# Patient Record
Sex: Male | Born: 1950 | Race: White | Hispanic: No | Smoking: Former smoker
Health system: Southern US, Community
[De-identification: ages and names within clinical notes are randomized; demographics above are authoritative.]

## PROBLEM LIST (undated history)

## (undated) DIAGNOSIS — I10 Essential (primary) hypertension: Secondary | ICD-10-CM

## (undated) DIAGNOSIS — Z87442 Personal history of urinary calculi: Secondary | ICD-10-CM

## (undated) DIAGNOSIS — C801 Malignant (primary) neoplasm, unspecified: Secondary | ICD-10-CM

## (undated) DIAGNOSIS — I2699 Other pulmonary embolism without acute cor pulmonale: Secondary | ICD-10-CM

## (undated) HISTORY — PX: OTHER SURGICAL HISTORY: SHX169

## (undated) HISTORY — PX: ABDOMINAL SURGERY: SHX537

## (undated) HISTORY — PX: PROCTECTOMY: SHX315

## (undated) HISTORY — PX: HERNIA REPAIR: SHX51

## (undated) HISTORY — PX: KIDNEY STONE SURGERY: SHX686

---

## 1999-12-01 ENCOUNTER — Emergency Department (HOSPITAL_COMMUNITY): Admission: EM | Admit: 1999-12-01 | Discharge: 1999-12-01 | Payer: Self-pay | Admitting: Emergency Medicine

## 2001-07-21 ENCOUNTER — Ambulatory Visit (HOSPITAL_COMMUNITY): Admission: RE | Admit: 2001-07-21 | Discharge: 2001-07-21 | Payer: Self-pay | Admitting: Pediatrics

## 2001-07-21 ENCOUNTER — Encounter: Payer: Self-pay | Admitting: Pediatrics

## 2006-11-20 ENCOUNTER — Inpatient Hospital Stay (HOSPITAL_COMMUNITY): Admission: EM | Admit: 2006-11-20 | Discharge: 2006-11-21 | Payer: Self-pay | Admitting: Emergency Medicine

## 2006-11-21 ENCOUNTER — Ambulatory Visit: Payer: Self-pay | Admitting: Cardiology

## 2006-12-17 ENCOUNTER — Emergency Department (HOSPITAL_COMMUNITY): Admission: EM | Admit: 2006-12-17 | Discharge: 2006-12-17 | Payer: Self-pay | Admitting: Emergency Medicine

## 2006-12-22 ENCOUNTER — Emergency Department (HOSPITAL_COMMUNITY): Admission: EM | Admit: 2006-12-22 | Discharge: 2006-12-22 | Payer: Self-pay | Admitting: Emergency Medicine

## 2007-03-01 ENCOUNTER — Inpatient Hospital Stay (HOSPITAL_COMMUNITY): Admission: EM | Admit: 2007-03-01 | Discharge: 2007-03-03 | Payer: Self-pay | Admitting: Emergency Medicine

## 2008-08-17 IMAGING — CT CT CHEST W/ CM
2 of 3 series · 15 of 36 positions shown, 18 images · IV contrast (Omnipaque 300)
Comparison: None.

CLINICAL DATA: Smoking history.  Question lung nodule on chest x-ray of 11/20/06. 
 CHEST CT WITH CONTRAST:
TECHNIQUE: Multidetector CT imaging of the chest was performed following the standard protocol during bolus administration of intravenous contrast.
 Contrast:  80 cc Omnipaque 300.

[Series 2: chestroutine 5.0 b40f · axial · 0.63mm/px · z∈[-324,-34]mm · 12 of 70 slices shown, 15 images]
[im 6/70  mediastinal]
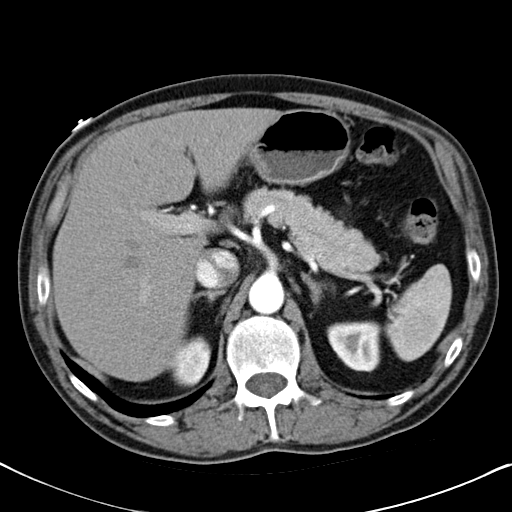
[im 6/70  lung]
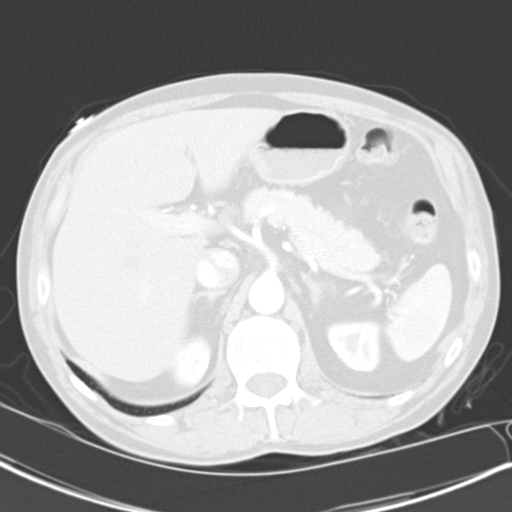
[im 11/70  lung]
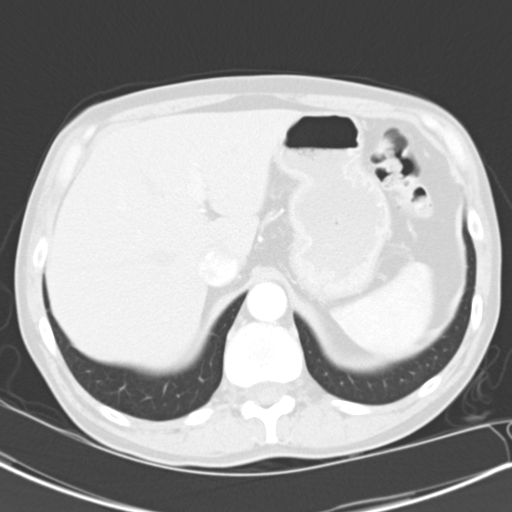
[im 16/70  lung]
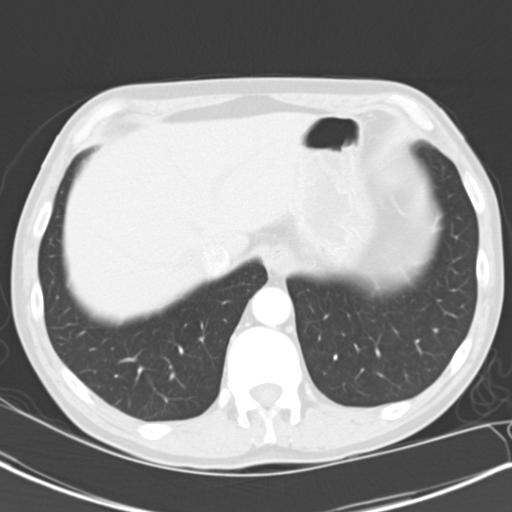
[im 21/70  lung]
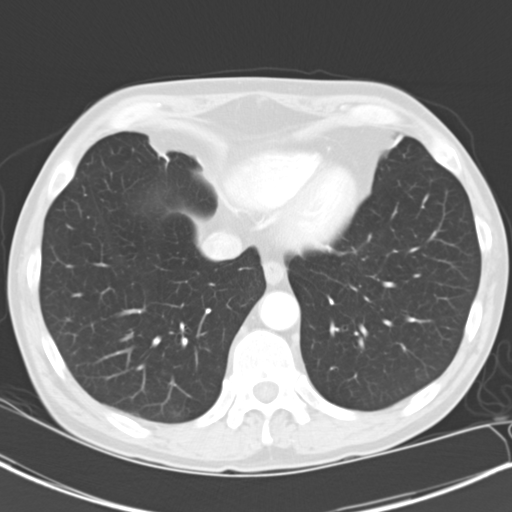
[im 26/70  mediastinal]
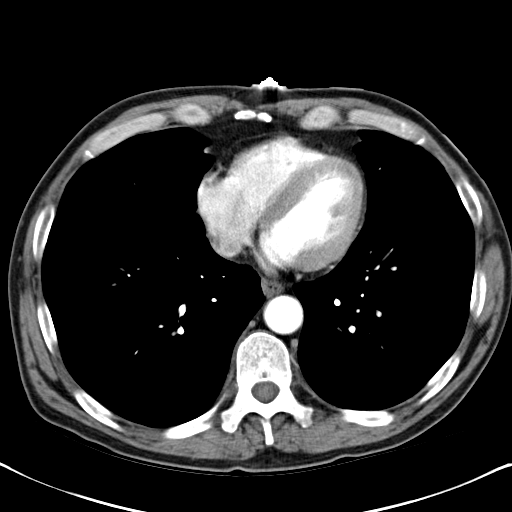
[im 26/70  lung]
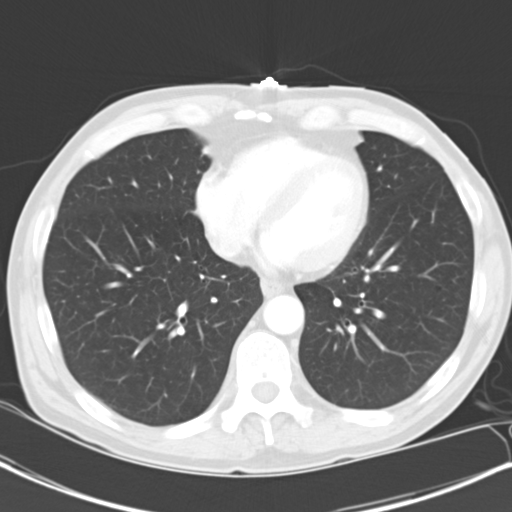
[im 31/70  lung]
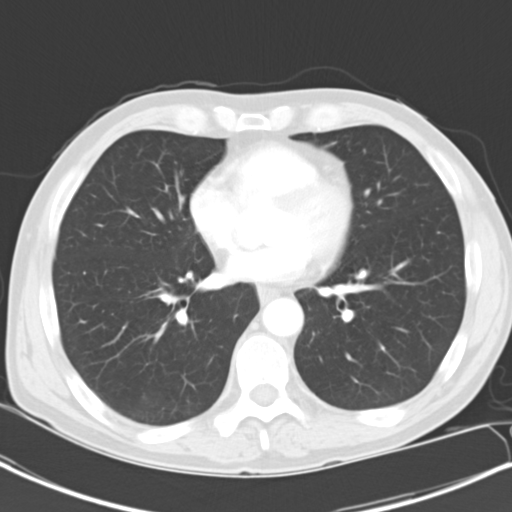
[im 39/70  lung]
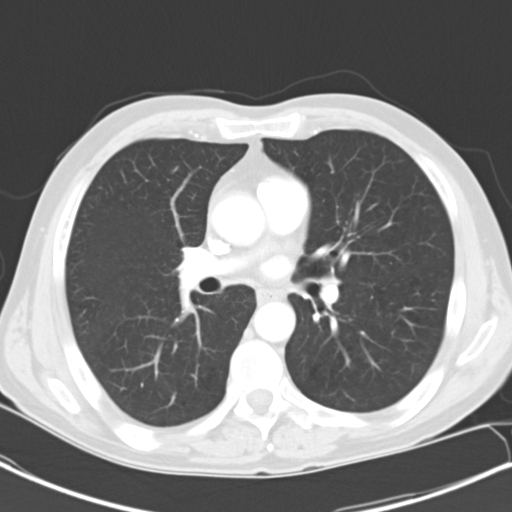
[im 44/70  lung]
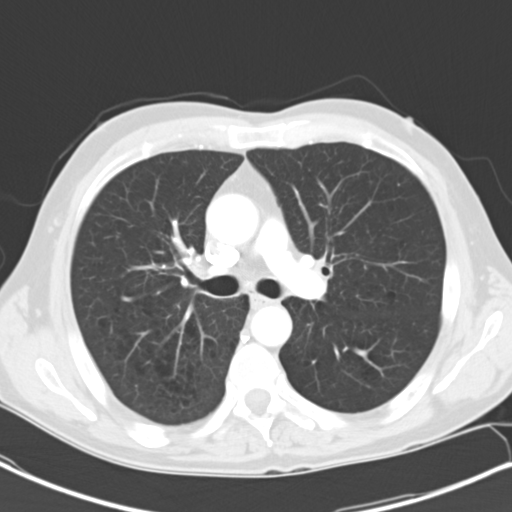
[im 49/70  mediastinal]
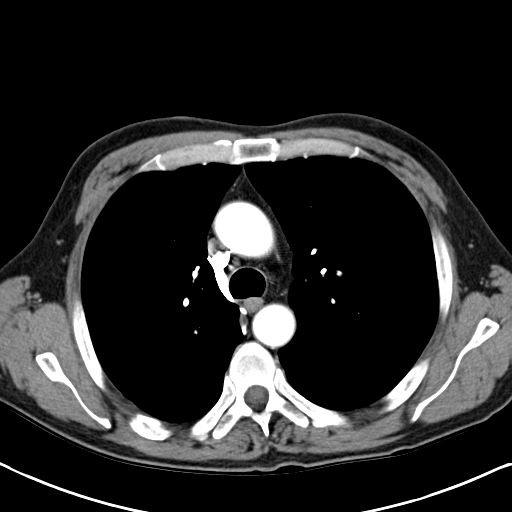
[im 49/70  lung]
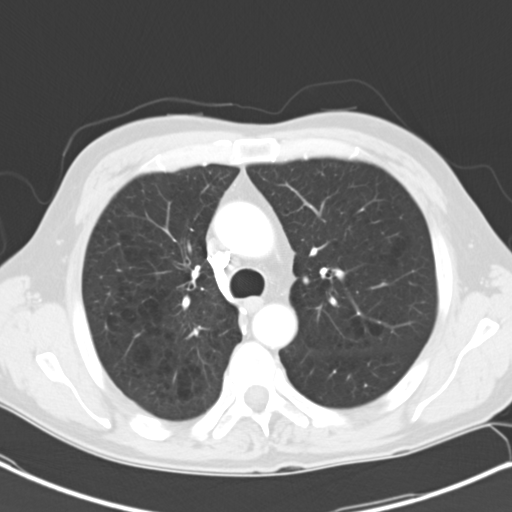
[im 54/70  lung]
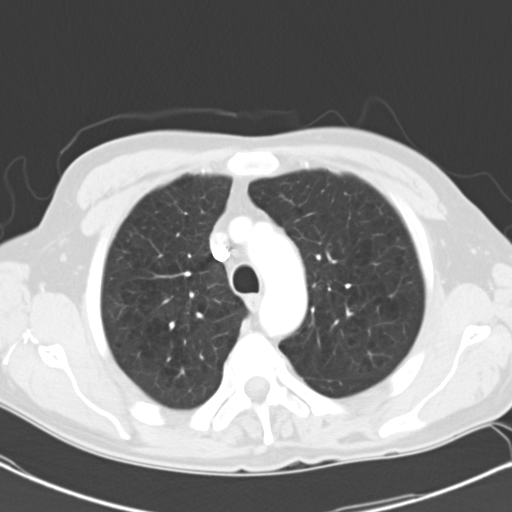
[im 59/70  lung]
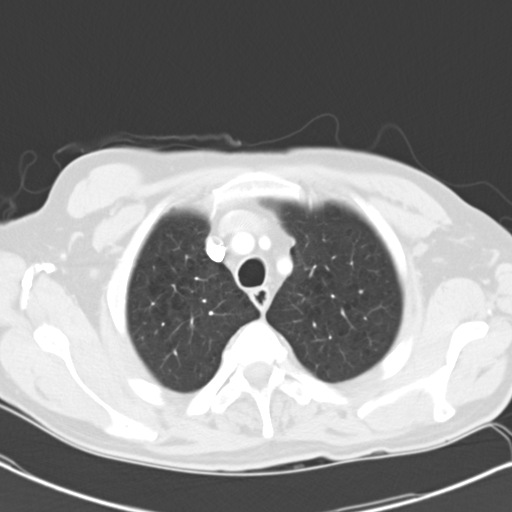
[im 64/70  lung]
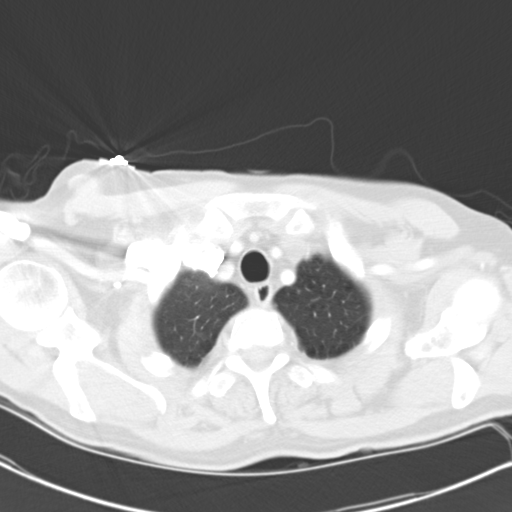

[Series 4: mpr coronal chest · coronal · 0.64mm/px · 3 of 70 slices shown]
[im 14/70  lung]
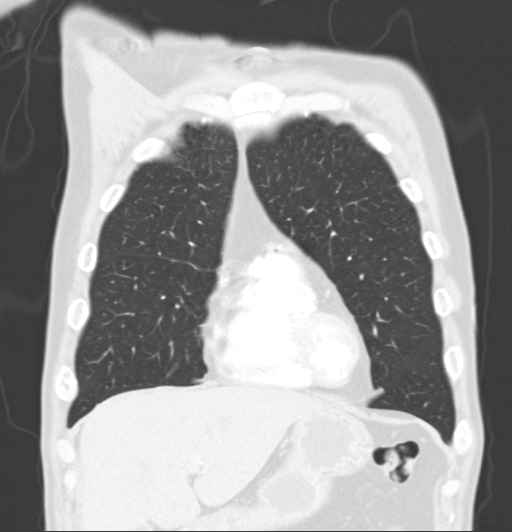
[im 28/70  lung]
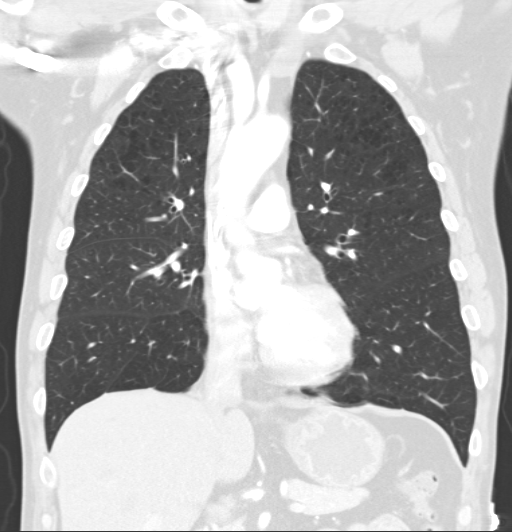
[im 42/70  lung]
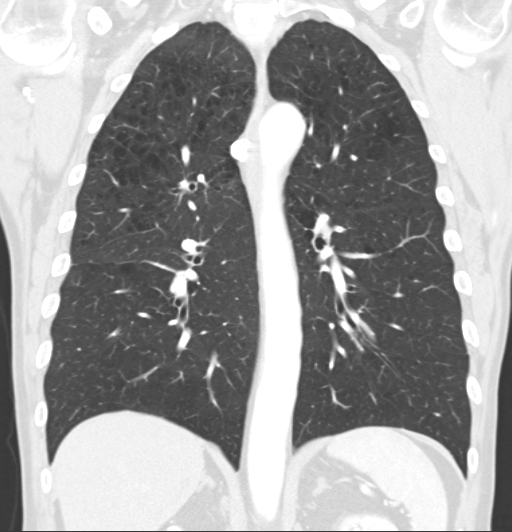

[15 of 36 positions shown; findings below may reference images not displayed]

FINDINGS: The nodule questioned in the right upper lung field on chest x-ray represents a small blastic lesion with in the medial right scapula consistent with a benign bony lesion if the patient has no history of metastatic disease.  There are diffuse changes of COPD.  However, no definite lung nodule is seen.  No effusion is noted.  No mediastinal or hilar adenopathy is seen.  Pulmonary arteries and thoracic aorta opacify with no acute abnormality.
IMPRESSION: 1.  The questioned lung nodule on chest x-ray in the right upper lung field represents a small blastic right scapular lesion, most likely benign if the patient does not have a history of malignancy.    No lung nodule is seen.  
 2.  Changes of COPD.

## 2012-01-24 ENCOUNTER — Emergency Department (HOSPITAL_COMMUNITY)
Admission: EM | Admit: 2012-01-24 | Discharge: 2012-01-24 | Disposition: A | Discharge status: Home / Self Care | Payer: Non-veteran care | Attending: Emergency Medicine | Admitting: Emergency Medicine

## 2012-01-24 ENCOUNTER — Other Ambulatory Visit: Payer: Self-pay

## 2012-01-24 ENCOUNTER — Emergency Department (HOSPITAL_COMMUNITY): Payer: Non-veteran care

## 2012-01-24 ENCOUNTER — Encounter (HOSPITAL_COMMUNITY): Payer: Self-pay | Admitting: Emergency Medicine

## 2012-01-24 DIAGNOSIS — R071 Chest pain on breathing: Secondary | ICD-10-CM | POA: Insufficient documentation

## 2012-01-24 DIAGNOSIS — R0602 Shortness of breath: Secondary | ICD-10-CM | POA: Insufficient documentation

## 2012-01-24 DIAGNOSIS — I10 Essential (primary) hypertension: Secondary | ICD-10-CM | POA: Insufficient documentation

## 2012-01-24 DIAGNOSIS — R0789 Other chest pain: Secondary | ICD-10-CM

## 2012-01-24 HISTORY — DX: Essential (primary) hypertension: I10

## 2012-01-24 LAB — CBC
HCT: 42.2 % (ref 39.0–52.0)
MCHC: 34.8 g/dL (ref 30.0–36.0)
MCV: 97.5 fL (ref 78.0–100.0)
RDW: 12.3 % (ref 11.5–15.5)
WBC: 7.9 10*3/uL (ref 4.0–10.5)

## 2012-01-24 LAB — DIFFERENTIAL
Basophils Relative: 1 % (ref 0–1)
Lymphs Abs: 2.5 10*3/uL (ref 0.7–4.0)
Monocytes Relative: 9 % (ref 3–12)
Neutrophils Relative %: 56 % (ref 43–77)

## 2012-01-24 LAB — BASIC METABOLIC PANEL
BUN: 6 mg/dL (ref 6–23)
Creatinine, Ser: 0.68 mg/dL (ref 0.50–1.35)
Sodium: 137 mEq/L (ref 135–145)

## 2012-01-24 MED ORDER — HYDROCODONE-ACETAMINOPHEN 5-325 MG PO TABS: 1.0000 | ORAL_TABLET | Freq: Four times a day (QID) | ORAL | Status: AC | PRN

## 2012-01-24 MED ORDER — NAPROXEN 500 MG PO TABS: 500.0000 mg | ORAL_TABLET | Freq: Two times a day (BID) | ORAL | Status: AC

## 2012-01-24 MED ORDER — OXYCODONE-ACETAMINOPHEN 5-325 MG PO TABS
1.0000 | ORAL_TABLET | Freq: Once | ORAL | Status: AC
  Administered 2012-01-24: 1 via ORAL
  Filled 2012-01-24: qty 1

## 2012-01-24 NOTE — ED Notes (Signed)
Patient with no complaints at this time. Respirations even and unlabored. Skin warm/dry. Discharge instructions reviewed with patient at this time. Patient given opportunity to voice concerns/ask questions. IV removed per policy and band-aid applied to site. Patient discharged at this time and left Emergency Department with steady gait.  

## 2012-01-24 NOTE — ED Provider Notes (Signed)
History  This chart was scribed for Gary Lennert, MD by Bennett Scrape. This patient was seen in room APA11/APA11 and the patient's care was started at 3:11PM.  CSN: [id number]  Arrival date & time 01/24/12  1427   First MD Initiated Contact with Patient 01/24/12 1508      Chief Complaint  Patient presents with  . Chest Pain    Patient is a 61 y.o. male presenting with chest pain. The history is provided by the patient. No language interpreter was used.  Chest Pain The chest pain began more  than 1 month ago. Chest pain occurs constantly. The chest pain is unchanged. The severity of the pain is moderate. The quality of the pain is described as pressure-like and sharp. The pain radiates to the left neck and left arm. Primary symptoms include shortness of breath. Pertinent negatives for primary symptoms include no fever, no fatigue, no cough, no abdominal pain, no nausea and no vomiting. Risk factors include no known risk factors.  His past medical history is significant for hypertension.  Pertinent negatives for past medical history include no MI, no PE and no seizures.  Pertinent negatives for family medical history include: no PE in family and no stroke in family.     Gary Hood is a 61 y.o. male who presents to the Emergency Department complaining of 3 months of gradual onset, non-changing, constant left chest pain described as a pressure feeling. The chest pain radiates into the left neck and left arm. He lists intermittent SOB as an associated symptom. Pt states that the pt is worse with movement of the left arm. He reports that occasionally the pain becomes a really sharp intense pain with no known cause. Pt states that he came to the ED today, becuase the pain is just not going away. He reports [facility] and [facility] told that the cause of the pain was not heart related. He states that he did have blood work done but no chest x-ray. He was treated with  pain shots that he states temporarily improved the pain. He has not tried any medications at home to improve the pain. He denies abdominal pain, nausea, emesis, diarrhea, cough, and fever as associated symptoms. He has a h/o HTN. He is a former smoker (Quit 2 years ago) and occasional alcohol user.  Pt's PCP is Dr. Malen Gauze at the [facility].  Past Medical History  Diagnosis Date  . Hypertension     Past Surgical History  Procedure Date  . Abdominal surgery   . Kidney stone surgery   . Hernia repair   . Skin cancer removal     No family history on file.  History  Substance Use Topics  . Smoking status: Current Some Day Smoker    Types: Pipe  . Smokeless tobacco: Not on file  . Alcohol Use: Yes     "occasionally"      Review of Systems  Constitutional: Negative for fever and fatigue.  HENT: Negative for congestion, sinus pressure and ear discharge.   Eyes: Negative for discharge.  Respiratory: Positive for shortness of breath. Negative for cough.   Cardiovascular: Positive for chest pain.  Gastrointestinal: Negative for nausea, vomiting, abdominal pain and diarrhea.  Genitourinary: Negative for frequency and hematuria.  Musculoskeletal: Negative for back pain.  Skin: Negative for rash.  Neurological: Negative for seizures and headaches.  Hematological: Negative.   Psychiatric/Behavioral: Negative for hallucinations.    Allergies  Codeine  Home Medications  No current outpatient prescriptions on file.  Triage Vitals: BP 147/82  Pulse 85  Temp(Src) 98.2 F (36.8 C) (Oral)  Resp 16  Ht 5\' 9"  (1.753 m)  Wt 135 lb (61.236 kg)  BMI 19.94 kg/m2  SpO2 98%  Physical Exam  Constitutional: He is oriented to person, place, and time. He appears well-developed.  HENT:  Head: Normocephalic and atraumatic.  Eyes: Conjunctivae and EOM are normal. No scleral icterus.  Neck: Neck supple. No thyromegaly present.  Cardiovascular: Normal rate and regular rhythm.  Exam reveals no  gallop and no friction rub.   No murmur heard. Pulmonary/Chest: No stridor. He has no wheezes. He has no rales. He exhibits tenderness (Left anterior chest tenderness).  Abdominal: He exhibits no distension. There is no tenderness. There is no rebound.  Musculoskeletal: Normal range of motion. He exhibits no edema.  Lymphadenopathy:    He has no cervical adenopathy.  Neurological: He is oriented to person, place, and time. Coordination normal.  Skin: No rash noted. No erythema.       Cyst about 1.5 cm in the inner thigh of the left leg, it is non-tender  Psychiatric: He has a normal mood and affect. His behavior is normal.    ED Course  Procedures (including critical care time)  DIAGNOSTIC STUDIES: Oxygen Saturation is 98% on room air, normal by my interpretation.    COORDINATION OF CARE: 3:14PM-Discussed blood panel, chest x-ray and pain medications with pt and pt agreed to plan. 6:47PM-Discussed blood panel and chest x-ray results with pt and pt acknowledged results. Discussed discharge plan and pt agreed to plan.Checked a cyst on left leg near knee. Advised pt to follow up if cyst becomes painful or inflamed.  Labs Reviewed  BASIC METABOLIC PANEL - Abnormal; Notable for the following:    Potassium 3.4 (*)    Glucose, Bld 108 (*)    All other components within normal limits  CBC  DIFFERENTIAL  TROPONIN I  D-DIMER, QUANTITATIVE   Dg Chest Portable 1 View  01/24/2012  *RADIOLOGY REPORT*  Clinical Data: Chest pain concentrated on the left  PORTABLE CHEST - 1 VIEW  Comparison: Portable exam 1507 hours compared to 11/20/2006  Findings: Normal heart size, mediastinal contours, and pulmonary vascularity. Lungs emphysematous but clear. No pleural effusion or pneumothorax. No acute osseous findings.  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  Original Report Authenticated By: Lollie Marrow, M.D.     No diagnosis found.  .ededk  Date: 01/24/2012  Rate:84  Rhythm:  normal sinus rhythm  QRS Axis: normal  Intervals: normal  ST/T Wave abnormalities: normal  Conduction Disutrbances:none  Narrative Interpretation:   Old EKG Reviewed: none available    MDM    Normal studies and constant chest pain worse with movement    The chart was scribed for me under my direct supervision.  I personally performed the history, physical, and medical decision making and all procedures in the evaluation of this patient.Gary Lennert, MD 01/24/12 [phone]

## 2012-01-24 NOTE — Discharge Instructions (Signed)
Follow up with your md in 1 week for recheck °

## 2012-01-24 NOTE — ED Notes (Signed)
Patient with c/o chest pain, described as a pressure, that radiates to left arm, left neck and left ear. Patient reports CP started one month ago and has been intermittent until 1 week ago when it became constant. Reports intermittent shortness of breath.

## 2018-03-23 ENCOUNTER — Other Ambulatory Visit: Payer: Self-pay

## 2018-03-23 ENCOUNTER — Encounter (HOSPITAL_COMMUNITY): Payer: Self-pay

## 2018-03-23 ENCOUNTER — Emergency Department (HOSPITAL_COMMUNITY)
Admission: EM | Admit: 2018-03-23 | Discharge: 2018-03-23 | Disposition: A | Discharge status: Home / Self Care | Payer: Non-veteran care | Attending: Emergency Medicine | Admitting: Emergency Medicine

## 2018-03-23 DIAGNOSIS — Y69 Unspecified misadventure during surgical and medical care: Secondary | ICD-10-CM | POA: Diagnosis not present

## 2018-03-23 DIAGNOSIS — I10 Essential (primary) hypertension: Secondary | ICD-10-CM | POA: Insufficient documentation

## 2018-03-23 DIAGNOSIS — K117 Disturbances of salivary secretion: Secondary | ICD-10-CM

## 2018-03-23 DIAGNOSIS — T7840XA Allergy, unspecified, initial encounter: Secondary | ICD-10-CM | POA: Diagnosis present

## 2018-03-23 DIAGNOSIS — F17200 Nicotine dependence, unspecified, uncomplicated: Secondary | ICD-10-CM | POA: Insufficient documentation

## 2018-03-23 DIAGNOSIS — L299 Pruritus, unspecified: Secondary | ICD-10-CM | POA: Insufficient documentation

## 2018-03-23 DIAGNOSIS — R682 Dry mouth, unspecified: Secondary | ICD-10-CM | POA: Diagnosis not present

## 2018-03-23 DIAGNOSIS — T887XXA Unspecified adverse effect of drug or medicament, initial encounter: Secondary | ICD-10-CM | POA: Diagnosis not present

## 2018-03-23 MED ORDER — BIOTENE DRY MOUTH MT LIQD: 15.0000 mL | OROMUCOSAL | 0 refills | Status: DC | PRN

## 2018-03-23 MED ORDER — DEXAMETHASONE 4 MG PO TABS
10.0000 mg | ORAL_TABLET | Freq: Once | ORAL | Status: AC
  Administered 2018-03-23: 10 mg via ORAL
  Filled 2018-03-23: qty 3

## 2018-03-23 NOTE — Discharge Instructions (Signed)
You may have had a mild reaction to one of your medications. You were given a medication in the ED to help with the itching. Please continue to take your medications as prescribed. Call [phone] if you develop shortness of breath, tongue or facial swelling, trouble swallowing. You may use biotene for your dry mouth.

## 2018-03-23 NOTE — ED Provider Notes (Signed)
[facility] EMERGENCY DEPARTMENT Provider Note   CSN: [id number] Arrival date & time: 03/23/18  1854     History   Chief Complaint Chief Complaint  Patient presents with  . Allergic Reaction    bilateral elbows itch    HPI Gary Hood is a 67 y.o. male.  Patient is one week s/p proctectomy. About one hour after taking his medicines today, he developed itching in the antecubital area of both arms. No noted rash or redness. Denies shortness of breath, difficulty swallowing, tongue swelling, nausea or vomiting.  He is also complaining of a significantly dry mouth with the use of the oxybutynin. He contacted his provider and was advised to discontinue the diclofenac due to ankle swelling. Patient is not currently in any distress. He has a urinary catheter in place that is draining urine.   The history is provided by the patient. No language interpreter was used.  Allergic Reaction  Presenting symptoms: itching   Presenting symptoms: no difficulty breathing, no difficulty swallowing, no rash, no swelling and no wheezing   Severity:  Mild Prior allergic episodes:  No prior episodes Context: medications   Ineffective treatments:  None tried   Past Medical History:  Diagnosis Date  . Hypertension     There are no active problems to display for this patient.   Past Surgical History:  Procedure Laterality Date  . ABDOMINAL SURGERY    . HERNIA REPAIR    . KIDNEY STONE SURGERY    . PROCTECTOMY    . skin cancer removal          Home Medications    Prior to Admission medications   Medication Sig Start Date End Date Taking? Authorizing Provider  chlorthalidone (HYGROTON) 25 MG tablet Take 6 mg by mouth daily. **Prescribed to Take one tablet by mouth on Mondays, Wednesdays, and Fridays only**    [provider]  ibuprofen (ADVIL,MOTRIN) 200 MG tablet Take 200 mg by mouth once as needed. For pain    [provider]    Family History No family history on  file.  Social History Social History   Tobacco Use  . Smoking status: Current Some Day Smoker    Types: Pipe  . Smokeless tobacco: Never Used  Substance Use Topics  . Alcohol use: Yes    Comment: "occasionally"  . Drug use: No     Allergies   Albumin (human); Aspirin; B12 folate [cobalamine combinations]; Codeine; and Diclofenac   Review of Systems Review of Systems  HENT: Negative for facial swelling and trouble swallowing.   Respiratory: Negative for wheezing.   Gastrointestinal: Negative for abdominal pain, nausea and vomiting.  Skin: Positive for itching. Negative for rash.  All other systems reviewed and are negative.    Physical Exam Updated Vital Signs BP 140/77 (BP Location: Right Arm)   Pulse 96   Temp 98.9 F (37.2 C) (Oral)   Resp 16   Ht 5\' 9"  (1.753 m)   Wt 63.5 kg (140 lb)   SpO2 97%   BMI 20.67 kg/m   Physical Exam  Constitutional: He is oriented to person, place, and time. He appears well-developed and well-nourished. No distress.  HENT:  Head: Normocephalic.  Eyes: Conjunctivae are normal.  Neck: Neck supple.  Cardiovascular: Normal rate and regular rhythm.  Pulmonary/Chest: Effort normal and breath sounds normal.  Abdominal: Soft. He exhibits no distension. There is no tenderness.  Musculoskeletal: Normal range of motion. He exhibits no edema.  Lymphadenopathy:  He has no cervical adenopathy.  Neurological: He is alert and oriented to person, place, and time.  Skin: Skin is warm and dry. No rash noted.  Psychiatric: He has a normal mood and affect.     ED Treatments / Results  Labs (all labs ordered are listed, but only abnormal results are displayed) Labs Reviewed - No data to display  EKG None  Radiology No results found.  Procedures Procedures (including critical care time)  Medications Ordered in ED Medications  dexamethasone (DECADRON) tablet 10 mg (10 mg Oral Given 03/23/18 2020)     Initial Impression /  Assessment and Plan / ED Course  I have reviewed the triage vital signs and the nursing notes.  Pertinent labs & imaging results that were available during my care of the patient were reviewed by me and considered in my medical decision making (see chart for details).    Patient discussed with and seen by Dr. Roxanne Mins.   Patient re-evaluated prior to dc, is hemodynamically stable, in no respiratory distress, and denies the feeling of throat closing. Pt has been advised to return to the ED if they have a mod-severe allergic rxn (s/s including throat closing, difficulty breathing, swelling of lips face or tongue). Pt is to follow up with their PCP. Pt is agreeable with plan & verbalizes understanding.  Final Clinical Impressions(s) / ED Diagnoses   Final diagnoses:  Xerostomia  Side effect of drug    ED Discharge Orders        Ordered    antiseptic oral rinse (BIOTENE) LIQD  As needed     03/23/18 2035       Etta Quill, NP 86/57/84 [id number]    Delora Fuel, MD 95/28/41 [phone]

## 2018-03-23 NOTE — ED Triage Notes (Signed)
Pt reports being prescribed oxybutynin, docusate, gabapentin, and diclofenac that was prescribed a week ago after have proctectomy.  Pt says he had an allergic rx to diclofenac of bilateral ankle swelling, so he quit taking it 2 days ago. Pt says now both elbows are itching, no other areas reported. No rash visible.

## 2018-10-11 ENCOUNTER — Encounter: Payer: Self-pay | Admitting: Allergy & Immunology

## 2018-10-11 ENCOUNTER — Ambulatory Visit (INDEPENDENT_AMBULATORY_CARE_PROVIDER_SITE_OTHER): Payer: PRIVATE HEALTH INSURANCE | Admitting: Allergy & Immunology

## 2018-10-11 VITALS — BP 120/64 | HR 72 | Temp 97.6°F | Resp 16 | Ht 68.11 in | Wt 144.8 lb

## 2018-10-11 DIAGNOSIS — T781XXD Other adverse food reactions, not elsewhere classified, subsequent encounter: Secondary | ICD-10-CM | POA: Diagnosis not present

## 2018-10-11 NOTE — Patient Instructions (Addendum)
1. Adverse food reaction - We will get some labs to look for a red meat allergy (alpha gal syndrome). - We will get some labs to rule out mast cell disease (serum tryptase). - We will call you in 1-2 weeks with the results of the testing. - I do not think that additional testing is needed at this time since you tolerate the foods sometimes and then you do not tolerate them at other times. - I do not think that an EpiPen is needed at this time.   2. Return if symptoms worsen or fail to improve.   Please inform us of any Emergency Department visits, hospitalizations, or changes in symptoms. Call us before going to the ED for breathing or allergy symptoms since we might be able to fit you in for a sick visit. Feel free to contact us anytime with any questions, problems, or concerns.  It was a pleasure to meet you today!  Websites that have reliable patient information: 1. American Academy of Asthma, Allergy, and Immunology: www.aaaai.org 2. Food Allergy Research and Education (FARE): foodallergy.org 3. Mothers of Asthmatics: http://www.asthmacommunitynetwork.org 4. American College of Allergy, Asthma, and Immunology: MonthlyElectricBill.co.uk   Make sure you are registered to vote! If you have moved or changed any of your contact information, you will need to get this updated before voting!
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
## 2018-10-16 LAB — ALPHA-GAL PANEL
ALPHA GAL IGE: 80.8 kU/L — AB (ref <0.10)
Beef (Bos spp) IgE: 23.4 kU/L — ABNORMAL HIGH (ref <0.35)
Class Interpretation: 2
Class Interpretation: 4
Lamb/Mutton (Ovis spp) IgE: 3.29 kU/L — ABNORMAL HIGH (ref <0.35)
PORK CLASS INTERPRETATION: 3
Pork (Sus spp) IgE: 6.67 kU/L — ABNORMAL HIGH (ref <0.35)

## 2018-10-16 LAB — IGE+ALLERGENS ZONE 2(30)
Alternaria Alternata IgE: <0.1 kU/L
Amer Sycamore IgE Qn: <0.1 kU/L
Aspergillus Fumigatus IgE: <0.1 kU/L
Cat Dander IgE: 0.99 kU/L — AB
Cockroach, American IgE: <0.1 kU/L
D Farinae IgE: <0.1 kU/L
Dog Dander IgE: 1.59 kU/L — AB
Hickory, White IgE: <0.1 kU/L
IGE (IMMUNOGLOBULIN E), SERUM: 778 [IU]/mL — AB (ref 6–495)
Maple/Box Elder IgE: <0.1 kU/L
Mucor Racemosus IgE: <0.1 kU/L
Nettle IgE: <0.1 kU/L
Penicillium Chrysogen IgE: <0.1 kU/L
Sheep Sorrel IgE Qn: <0.1 kU/L
Sweet gum IgE RAST Ql: <0.1 kU/L
White Mulberry IgE: <0.1 kU/L

## 2018-10-16 LAB — TRYPTASE: TRYPTASE: 9.1 ug/L (ref 2.2–13.2)

## 2018-10-17 ENCOUNTER — Other Ambulatory Visit: Payer: Self-pay

## 2018-10-17 MED ORDER — EPINEPHRINE 0.3 MG/0.3ML IJ SOAJ: 0.3000 mg | Freq: Once | INTRAMUSCULAR | 2 refills | Status: AC

## 2018-10-17 NOTE — Telephone Encounter (Signed)
Epi pen sent to patient local pharmacy due to is most recent labs showing him having allergies to red meat per Dr. Ernst Bowler.

## 2022-11-29 ENCOUNTER — Ambulatory Visit: Payer: Non-veteran care | Admitting: Urology

## 2022-12-06 ENCOUNTER — Other Ambulatory Visit: Payer: Self-pay | Admitting: *Deleted

## 2022-12-06 DIAGNOSIS — K4090 Unilateral inguinal hernia, without obstruction or gangrene, not specified as recurrent: Secondary | ICD-10-CM

## 2022-12-07 ENCOUNTER — Encounter: Payer: Self-pay | Admitting: Surgery

## 2022-12-07 ENCOUNTER — Ambulatory Visit (INDEPENDENT_AMBULATORY_CARE_PROVIDER_SITE_OTHER): Payer: No Typology Code available for payment source | Admitting: Surgery

## 2022-12-07 VITALS — BP 161/95 | HR 70 | Temp 98.3°F | Resp 12 | Ht 68.0 in | Wt 132.0 lb

## 2022-12-07 DIAGNOSIS — K4090 Unilateral inguinal hernia, without obstruction or gangrene, not specified as recurrent: Secondary | ICD-10-CM | POA: Diagnosis not present

## 2022-12-07 NOTE — Progress Notes (Signed)
[facility] History and Physical  Reason for Referral:*** Referring Physician: ***  Chief Complaint   New Patient (Initial Visit)     Gary Hood is a 72 y.o. male.  HPI: ***.  The *** started *** and has had a duration of ***.  It is associated with ***.  The *** is improved with ***, and is made worse with ***.    Quality*** Context***  Past Medical History:  Diagnosis Date  . Hypertension     Past Surgical History:  Procedure Laterality Date  . ABDOMINAL SURGERY    . HERNIA REPAIR    . KIDNEY STONE SURGERY    . PROCTECTOMY    . skin cancer removal      No family history on file.  Social History   Tobacco Use  . Smoking status: Some Days    Types: Pipe  . Smokeless tobacco: Never  Substance Use Topics  . Alcohol use: Yes    Comment: "occasionally"  . Drug use: No    Medications: {medication reviewed/display:3041432} Allergies as of 12/07/2022       Reactions   Albumin (human)    Aspirin    B12 Folate [cobalamin Combinations]    Codeine Itching   Diclofenac         Medication List        Accurate as of December 07, 2022  9:55 AM. If you have any questions, ask your nurse or doctor.          STOP taking these medications    cyanocobalamin 1000 MCG tablet Commonly known as: VITAMIN B12 Stopped by: Roshanna Cimino A Deeya Richeson, DO         ROS:  {Review of Systems:30496}  Blood pressure (!) 161/95, pulse 70, temperature 98.3 F (36.8 C), temperature source Oral, resp. rate 12, height 5' 8"$  (1.727 m), weight 132 lb (59.9 kg), SpO2 97 %. Physical Exam  Results: No results found for this or any previous visit (from the past 48 hour(s)).  No results found.   Assessment & Plan:  Gary Hood is a 72 y.o. male with *** -*** -*** -Follow up ***  All questions were answered to the satisfaction of the patient and family***.  The risk and benefits of *** were discussed including but not limited to ***.  After careful  consideration, Jaric Benetti has decided to ***.    Yliana Gravois A Mert Dietrick 12/07/2022, 9:55 AM   Graciella Freer, DO [facility] [address] [place], [place] [address] [phone] (office)

## 2022-12-09 NOTE — H&P (Signed)
[facility] History and Physical   Reason for Referral: Right inguinal hernia Referring Physician: [facility]   Chief Complaint   New Patient (Initial Visit)        Gary Hood is a 72 y.o. male.  HPI: Patient presents for evaluation of a right inguinal hernia.  Gary Hood first noticed this area last summer.  Gary Hood intermittently will have pain issues, and the hernia will self reduce in the evenings.  Gary Hood is tolerating a diet without nausea and vomiting, and is moving his bowels without issue.  His last bowel movement was this morning.  Gary Hood denies use of blood thinning medications.  His past medical history is significant for hypertension and heart ablation 6 years ago.  His surgical history is significant for left inguinal hernia surgery, prostate surgery, and multiple kidney stone surgeries.  Gary Hood denies use of tobacco products, alcohol, and illicit drugs.       Past Medical History:  Diagnosis Date   Hypertension             Past Surgical History:  Procedure Laterality Date   ABDOMINAL SURGERY       HERNIA REPAIR       KIDNEY STONE SURGERY       PROCTECTOMY       skin cancer removal          No family history on file.   Social History         Tobacco Use   Smoking status: Some Days      Types: Pipe   Smokeless tobacco: Never  Substance Use Topics   Alcohol use: Yes      Comment: "occasionally"   Drug use: No      Medications: I have reviewed the patient's current medications. Prior to Admission: (Not in a hospital admission)  Scheduled: Continuous: PRN: Allergies as of 12/07/2022         Reactions    Albumin (human)      Aspirin      B12 Folate [cobalamin Combinations]      Codeine Itching    Diclofenac              Medication List           Accurate as of December 07, 2022  9:55 AM. If you have any questions, ask your nurse or doctor.              STOP taking these medications     cyanocobalamin 1000 MCG tablet Commonly known  as: VITAMIN B12 Stopped by: Henley Boettner A Cheryel Kyte, DO               ROS:  Constitutional: negative for chills, fatigue, and fevers Eyes: negative for visual disturbance and pain Ears, nose, mouth, throat, and face: negative for ear drainage, sore throat, and sinus problems Respiratory: negative for cough, wheezing, and shortness of breath Cardiovascular: negative for chest pain and palpitations Gastrointestinal: positive for abdominal pain, negative for nausea, reflux symptoms, and vomiting Genitourinary:negative for dysuria and frequency Integument/breast: negative for dryness and rash Hematologic/lymphatic: negative for bleeding and lymphadenopathy Musculoskeletal:positive for back pain Neurological: negative for dizziness and tremors Endocrine: negative for temperature intolerance   Blood pressure (!) 161/95, pulse 70, temperature 98.3 F (36.8 C), temperature source Oral, resp. rate 12, height 5' 8"$  (1.727 m), weight 132 lb (59.9 kg), SpO2 97 %. Physical Exam Vitals reviewed.  Constitutional:      Appearance: Normal appearance.  HENT:  Head: Normocephalic and atraumatic.  Eyes:     Extraocular Movements: Extraocular movements intact.     Pupils: Pupils are equal, round, and reactive to light.  Cardiovascular:     Rate and Rhythm: Normal rate and regular rhythm.  Pulmonary:     Effort: Pulmonary effort is normal.     Breath sounds: Normal breath sounds.  Abdominal:     Comments: Abdomen soft, nondistended, no percussion tenderness, nontender to palpation; no rigidity, guarding, rebound tenderness; soft and reducible right inguinal hernia, no palpable left inguinal hernia  Musculoskeletal:        General: Normal range of motion.     Cervical back: Normal range of motion.  Skin:    General: Skin is warm and dry.  Neurological:     General: No focal deficit present.     Mental Status: Gary Hood is alert and oriented to person, place, and time.  Psychiatric:        Mood  and Affect: Mood normal.        Behavior: Behavior normal.        Results: Lab Results Last 48 Hours  No results found for this or any previous visit (from the past 48 hour(s)).     Imaging Results (Last 48 hours)  No results found.       Assessment & Plan:  Gary Hood is a 72 y.o. male who presents for evaluation of left inguinal hernia.   -I explained the pathophysiology of hernias, and why we recommend surgical repair -The risk and benefits of open right inguinal hernia repair with mesh were discussed including but not limited to bleeding, infection, injury to surrounding structures, need for additional procedures, and hernia recurrence.  After careful consideration, Gary Hood has decided to proceed with surgery.  -Patient tentatively scheduled for surgery on 2/21 -Information provided to the patient regarding inguinal hernias -Advised him to present to the emergency department if Gary Hood begins to have nonreducible hernia, nausea, vomiting, and obstipation   All questions were answered to the satisfaction of the patient.     Graciella Freer, DO [facility] [address] [place], [place] [address] [phone] (office)

## 2022-12-10 NOTE — Patient Instructions (Signed)
Gary Hood  12/10/2022     @PREFPERIOPPHARMACY$ @   Your procedure is scheduled on 12/15/2022.  Report to [facility] at 6:45 A.M.  Call this number if you have problems the morning of surgery:  [phone]  If you experience any cold or flu symptoms such as cough, fever, chills, shortness of breath, etc. between now and your scheduled surgery, please notify us at the above number.   Remember:  Do not eat or drink after midnight.     Take these medicines the morning of surgery with A SIP OF WATER : Claritin    Do not wear jewelry, make-up or nail polish.  Do not wear lotions, powders, or perfumes, or deodorant.  Do not shave 48 hours prior to surgery.  Men may shave face and neck.  Do not bring valuables to the hospital.  [facility] is not responsible for any belongings or valuables.  Contacts, dentures or bridgework may not be worn into surgery.  Leave your suitcase in the car.  After surgery it may be brought to your room.  For patients admitted to the hospital, discharge time will be determined by your treatment team.  Patients discharged the day of surgery will not be allowed to drive home.   Name and phone number of your driver:   Family Special instructions:  N/A  Please read over the following fact sheets that you were given. Care and Recovery After Surgery  Open Hernia Repair, Adult Open hernia repair is a surgical procedure to fix a hernia. A hernia occurs when an internal organ or tissue pushes through a weak spot in the muscles along the wall of the abdomen. Hernias commonly occur in the groin and around the belly button. Most hernias tend to get worse over time. Often, surgery is done to prevent the hernia from becoming bigger, uncomfortable, or an emergency. Emergency surgery may be needed if contents of the abdomen get stuck in the opening (incarcerated hernia) or if the blood supply gets cut off (strangulated hernia). In an open repair, an incision is made in  the abdomen to perform the surgery. Tell a health care provider about: Any allergies you have. All medicines you are taking, including vitamins, herbs, eye drops, creams, and over-the-counter medicines. Any problems you or family members have had with anesthetic medicines. Any blood or bone disorders you have. Any surgeries you have had. Any medical conditions you have, including any recent cold or flu (influenza)symptoms. Whether you are pregnant or may be pregnant. What are the risks? Generally, this is a safe procedure. However, problems may occur, including: Long-lasting (chronic) pain. Bleeding. Infection. Damage to the testicles. This can cause shrinking or swelling. Damage to nearby structures or organs, including the bladder, blood vessels, intestines, or nerves near the hernia. Blood clots. Trouble passing urine. Return of the hernia. What happens before the procedure? Staying hydrated Follow instructions from your health care provider about hydration, which may include: Up to 2 hours before the procedure - you may continue to drink clear liquids, such as water, clear fruit juice, black coffee, and plain tea.  Eating and drinking restrictions Follow instructions from your health care provider about eating and drinking, which may include: 8 hours before the procedure - stop eating heavy meals or foods, such as meat, fried foods, or fatty foods. 6 hours before the procedure - stop eating light meals or foods, such as toast or cereal. 6 hours before the procedure - stop drinking milk or drinks that  contain milk. 2 hours before the procedure - stop drinking clear liquids. Medicines Ask your health care provider about: Changing or stopping your regular medicines. This is especially important if you are taking diabetes medicines or blood thinners. Taking medicines such as aspirin and ibuprofen. These medicines can thin your blood. Do not take these medicines unless your health  care provider tells you to take them. Taking over-the-counter medicines, vitamins, herbs, and supplements. Surgery safety Ask your health care provider: How your surgery site will be marked. What steps will be taken to help prevent infection. These steps may include: Removing hair at the surgery site. Washing skin with a germ-killing soap. Receiving antibiotic medicine. General instructions You may have an exam or testing, such as blood tests or imaging studies. Do not use any products that contain nicotine or tobacco for at least 4 weeks before the procedure. These products include cigarettes, chewing tobacco, and vaping devices, such as e-cigarettes. If you need help quitting, ask your health care provider. Let your health care provider know if you develop a cold or any infection before your surgery. If you get an infection before surgery, you may receive antibiotics to treat it. Plan to have a responsible adult take you home from the hospital or clinic. If you will be going home right after the procedure, plan to have a responsible adult care for you for the time you are told. This is important. What happens during the procedure?  An IV will be inserted into one of your veins. You will be given one or more of the following: A medicine to help you relax (sedative). A medicine to numb the area (local anesthetic). A medicine to make you fall asleep (general anesthetic). Your surgeon will make an incision over the hernia. The tissues of the hernia will be moved back into place. The edges of the hernia may be stitched (sutured) together. The opening in the abdominal muscles will be closed with stitches (sutures). Or, your surgeon will place a mesh patch made of artificial (synthetic) material over the opening. The incision will be closed with sutures, skin glue, or adhesive strips. A bandage (dressing) may be placed over the incision. The procedure may vary among health care providers and  hospitals. What happens after the procedure? Your blood pressure, heart rate, breathing rate, and blood oxygen level will be monitored until you leave the hospital or clinic. You may be given medicine for pain. If you were given a sedative during the procedure, it can affect you for several hours. Do not drive or operate machinery until your health care provider says that it is safe. Summary Open hernia repair is a surgical procedure to fix a hernia. Hernias commonly occur in the groin and around the belly button. Emergency surgery may be needed if contents of the abdomen get stuck in the opening (incarcerated hernia) or if the blood supply gets cut off (strangulated hernia). In this procedure, an incision is made in the abdomen to perform the surgery. After the procedure, you may be given medicine for pain. This information is not intended to replace advice given to you by your health care provider. Make sure you discuss any questions you have with your health care provider. Document Revised: 05/26/2020 Document Reviewed: 05/26/2020 Elsevier Patient Education  [place] Anesthesia, Adult General anesthesia is the use of medicine to make you fall asleep (unconscious) for a medical procedure. General anesthesia must be used for certain procedures. It is often recommended for surgery  or procedures that: Last a long time. Require you to be still or in an unusual position. Are major and can cause blood loss. Affect your breathing. The medicines used for general anesthesia are called general anesthetics. During general anesthesia, these medicines are given along with medicines that: Prevent pain. Control your blood pressure. Relax your muscles. Prevent nausea and vomiting after the procedure. Tell a health care provider about: Any allergies you have. All medicines you are taking, including vitamins, herbs, eye drops, creams, and over-the-counter medicines. Your history of  any: Medical conditions you have, including: High blood pressure. Bleeding problems. Diabetes. Heart or lung conditions, such as: Heart failure. Sleep apnea. Asthma. Chronic obstructive pulmonary disease (COPD). Current or recent illnesses, such as: Upper respiratory, chest, or ear infections. Cough or fever. Tobacco or drug use, including marijuana or alcohol use. Depression or anxiety. Surgeries and types of anesthetics you have had. Problems you or family members have had with anesthetic medicines. Whether you are pregnant or may be pregnant. Whether you have any chipped or loose teeth, dentures, caps, bridgework, or issues with your mouth, swallowing, or choking. What are the risks? Your health care provider will talk with you about risks. These may include: Allergic reaction to the medicines. Lung and heart problems. Inhaling food or liquid from the stomach into the lungs (aspiration). Nerve injury. Injury to the lips, mouth, teeth, or gums. Stroke. Waking up during your procedure and being unable to move. This is rare. These problems are more likely to develop if you are having a major surgery or if you have an advanced or serious medical condition. You can prevent some of these complications by answering all of your health care provider's questions thoroughly and by following all instructions before your procedure. General anesthesia can cause side effects, including: Nausea or vomiting. A sore throat or hoarseness from the breathing tube. Wheezing or coughing. Shaking chills or feeling cold. Body aches. Sleepiness. Confusion, agitation (delirium), or anxiety. What happens before the procedure? When to stop eating and drinking Follow instructions from your health care provider about what you may eat and drink before your procedure. If you do not follow your health care provider's instructions, your procedure may be delayed or canceled. Medicines Ask your health care  provider about: Changing or stopping your regular medicines. These include any diabetes medicines or blood thinners you take. Taking medicines such as aspirin and ibuprofen. These medicines can thin your blood. Do not take them unless your health care provider tells you to. Taking over-the-counter medicines, vitamins, herbs, and supplements. General instructions Do not use any products that contain nicotine or tobacco for at least 4 weeks before the procedure. These products include cigarettes, chewing tobacco, and vaping devices, such as e-cigarettes. If you need help quitting, ask your health care provider. If you brush your teeth on the morning of the procedure, make sure to spit out all of the water and toothpaste. If told by your health care provider, bring your sleep apnea device with you to surgery (if applicable). If you will be going home right after the procedure, plan to have a responsible adult: Take you home from the hospital or clinic. You will not be allowed to drive. Care for you for the time you are told. What happens during the procedure?  An IV will be inserted into one of your veins. You will be given one or more of the following through a face mask or IV: A sedative. This helps you relax. Anesthesia. This  will: Numb certain areas of your body. Make you fall asleep for surgery. After you are unconscious, a breathing tube may be inserted down your throat to help you breathe. This will be removed before you wake up. An anesthesia provider, such as an anesthesiologist, will stay with you throughout your procedure. The anesthesia provider will: Keep you comfortable and safe by continuing to give you medicines and adjusting the amount of medicine that you get. Monitor your blood pressure, heart rate, and oxygen levels to make sure that the anesthetics do not cause any problems. The procedure may vary among health care providers and hospitals. What happens after the  procedure? Your blood pressure, temperature, heart rate, breathing rate, and blood oxygen level will be monitored until you leave the hospital or clinic. You will wake up in a recovery area. You may wake up slowly. You may be given medicine to help you with pain, nausea, or any other side effects from the anesthesia. Summary General anesthesia is the use of medicine to make you fall asleep (unconscious) for a medical procedure. Follow your health care provider's instructions about when to stop eating, drinking, or taking certain medicines before your procedure. Plan to have a responsible adult take you home from the hospital or clinic. This information is not intended to replace advice given to you by your health care provider. Make sure you discuss any questions you have with your health care provider. Document Revised: 01/07/2022 Document Reviewed: 01/07/2022 Elsevier Patient Education  [place].  How to Use Chlorhexidine Before Surgery Chlorhexidine gluconate (CHG) is a germ-killing (antiseptic) solution that is used to clean the skin. It can get rid of the bacteria that normally live on the skin and can keep them away for about 24 hours. To clean your skin with CHG, you may be given: A CHG solution to use in the shower or as part of a sponge bath. A prepackaged cloth that contains CHG. Cleaning your skin with CHG may help lower the risk for infection: While you are staying in the intensive care unit of the hospital. If you have a vascular access, such as a central line, to provide short-term or long-term access to your veins. If you have a catheter to drain urine from your bladder. If you are on a ventilator. A ventilator is a machine that helps you breathe by moving air in and out of your lungs. After surgery. What are the risks? Risks of using CHG include: A skin reaction. Hearing loss, if CHG gets in your ears and you have a perforated eardrum. Eye injury, if CHG gets in  your eyes and is not rinsed out. The CHG product catching fire. Make sure that you avoid smoking and flames after applying CHG to your skin. Do not use CHG: If you have a chlorhexidine allergy or have previously reacted to chlorhexidine. On babies younger than 103 months of age. How to use CHG solution Use CHG only as told by your health care provider, and follow the instructions on the label. Use the full amount of CHG as directed. Usually, this is one bottle. During a shower Follow these steps when using CHG solution during a shower (unless your health care provider gives you different instructions): Start the shower. Use your normal soap and shampoo to wash your face and hair. Turn off the shower or move out of the shower stream. Pour the CHG onto a clean washcloth. Do not use any type of brush or rough-edged sponge. Starting at  your neck, lather your body down to your toes. Make sure you follow these instructions: If you will be having surgery, pay special attention to the part of your body where you will be having surgery. Scrub this area for at least 1 minute. Do not use CHG on your head or face. If the solution gets into your ears or eyes, rinse them well with water. Avoid your genital area. Avoid any areas of skin that have broken skin, cuts, or scrapes. Scrub your back and under your arms. Make sure to wash skin folds. Let the lather sit on your skin for 1-2 minutes or as long as told by your health care provider. Thoroughly rinse your entire body in the shower. Make sure that all body creases and crevices are rinsed well. Dry off with a clean towel. Do not put any substances on your body afterward--such as powder, lotion, or perfume--unless you are told to do so by your health care provider. Only use lotions that are recommended by the manufacturer. Put on clean clothes or pajamas. If it is the night before your surgery, sleep in clean sheets.  During a sponge bath Follow these  steps when using CHG solution during a sponge bath (unless your health care provider gives you different instructions): Use your normal soap and shampoo to wash your face and hair. Pour the CHG onto a clean washcloth. Starting at your neck, lather your body down to your toes. Make sure you follow these instructions: If you will be having surgery, pay special attention to the part of your body where you will be having surgery. Scrub this area for at least 1 minute. Do not use CHG on your head or face. If the solution gets into your ears or eyes, rinse them well with water. Avoid your genital area. Avoid any areas of skin that have broken skin, cuts, or scrapes. Scrub your back and under your arms. Make sure to wash skin folds. Let the lather sit on your skin for 1-2 minutes or as long as told by your health care provider. Using a different clean, wet washcloth, thoroughly rinse your entire body. Make sure that all body creases and crevices are rinsed well. Dry off with a clean towel. Do not put any substances on your body afterward--such as powder, lotion, or perfume--unless you are told to do so by your health care provider. Only use lotions that are recommended by the manufacturer. Put on clean clothes or pajamas. If it is the night before your surgery, sleep in clean sheets. How to use CHG prepackaged cloths Only use CHG cloths as told by your health care provider, and follow the instructions on the label. Use the CHG cloth on clean, dry skin. Do not use the CHG cloth on your head or face unless your health care provider tells you to. When washing with the CHG cloth: Avoid your genital area. Avoid any areas of skin that have broken skin, cuts, or scrapes. Before surgery Follow these steps when using a CHG cloth to clean before surgery (unless your health care provider gives you different instructions): Using the CHG cloth, vigorously scrub the part of your body where you will be having  surgery. Scrub using a back-and-forth motion for 3 minutes. The area on your body should be completely wet with CHG when you are done scrubbing. Do not rinse. Discard the cloth and let the area air-dry. Do not put any substances on the area afterward, such as powder, lotion, or perfume.  Put on clean clothes or pajamas. If it is the night before your surgery, sleep in clean sheets.  For general bathing Follow these steps when using CHG cloths for general bathing (unless your health care provider gives you different instructions). Use a separate CHG cloth for each area of your body. Make sure you wash between any folds of skin and between your fingers and toes. Wash your body in the following order, switching to a new cloth after each step: The front of your neck, shoulders, and chest. Both of your arms, under your arms, and your hands. Your stomach and groin area, avoiding the genitals. Your right leg and foot. Your left leg and foot. The back of your neck, your back, and your buttocks. Do not rinse. Discard the cloth and let the area air-dry. Do not put any substances on your body afterward--such as powder, lotion, or perfume--unless you are told to do so by your health care provider. Only use lotions that are recommended by the manufacturer. Put on clean clothes or pajamas. Contact a health care provider if: Your skin gets irritated after scrubbing. You have questions about using your solution or cloth. You swallow any chlorhexidine. Call your local poison control center (1-(317)481-8497 in the [place] Get help right away if: Your eyes itch badly, or they become very red or swollen. Your skin itches badly and is red or swollen. Your hearing changes. You have trouble seeing. You have swelling or tingling in your mouth or throat. You have trouble breathing. These symptoms may represent a serious problem that is an emergency. Do not wait to see if the symptoms will go away. Get medical help  right away. Call your local emergency services (911 in the [place] Do not drive yourself to the hospital. Summary Chlorhexidine gluconate (CHG) is a germ-killing (antiseptic) solution that is used to clean the skin. Cleaning your skin with CHG may help to lower your risk for infection. You may be given CHG to use for bathing. It may be in a bottle or in a prepackaged cloth to use on your skin. Carefully follow your health care provider's instructions and the instructions on the product label. Do not use CHG if you have a chlorhexidine allergy. Contact your health care provider if your skin gets irritated after scrubbing. This information is not intended to replace advice given to you by your health care provider. Make sure you discuss any questions you have with your health care provider. Document Revised: 02/08/2022 Document Reviewed: 12/22/2020 Elsevier Patient Education  [place].

## 2022-12-13 ENCOUNTER — Encounter (HOSPITAL_COMMUNITY): Payer: Self-pay

## 2022-12-13 ENCOUNTER — Encounter (HOSPITAL_COMMUNITY)
Admission: RE | Admit: 2022-12-13 | Discharge: 2022-12-13 | Disposition: A | Discharge status: Home / Self Care | Payer: No Typology Code available for payment source | Source: Ambulatory Visit | Attending: Surgery | Admitting: Surgery

## 2022-12-13 VITALS — BP 155/78 | HR 76 | Temp 98.4°F | Resp 18 | Ht 68.0 in | Wt 132.0 lb

## 2022-12-13 DIAGNOSIS — Z01810 Encounter for preprocedural cardiovascular examination: Secondary | ICD-10-CM | POA: Diagnosis present

## 2022-12-13 DIAGNOSIS — I10 Essential (primary) hypertension: Secondary | ICD-10-CM | POA: Diagnosis not present

## 2022-12-13 HISTORY — DX: Malignant (primary) neoplasm, unspecified: C80.1

## 2022-12-13 HISTORY — DX: Personal history of urinary calculi: Z87.442

## 2022-12-15 ENCOUNTER — Other Ambulatory Visit: Payer: Self-pay

## 2022-12-15 ENCOUNTER — Ambulatory Visit (HOSPITAL_COMMUNITY)
Admission: RE | Admit: 2022-12-15 | Discharge: 2022-12-15 | Disposition: A | Discharge status: Home / Self Care | Payer: No Typology Code available for payment source | Attending: Surgery | Admitting: Surgery

## 2022-12-15 ENCOUNTER — Encounter (HOSPITAL_COMMUNITY): Payer: Self-pay | Admitting: Surgery

## 2022-12-15 ENCOUNTER — Encounter (HOSPITAL_COMMUNITY)
Admission: RE | Disposition: A | Discharge status: Home / Self Care | Payer: Self-pay | Source: Home / Self Care | Attending: Surgery

## 2022-12-15 ENCOUNTER — Ambulatory Visit (HOSPITAL_COMMUNITY): Payer: No Typology Code available for payment source | Admitting: Certified Registered Nurse Anesthetist

## 2022-12-15 ENCOUNTER — Ambulatory Visit (HOSPITAL_BASED_OUTPATIENT_CLINIC_OR_DEPARTMENT_OTHER): Payer: No Typology Code available for payment source | Admitting: Certified Registered Nurse Anesthetist

## 2022-12-15 DIAGNOSIS — K4090 Unilateral inguinal hernia, without obstruction or gangrene, not specified as recurrent: Secondary | ICD-10-CM | POA: Insufficient documentation

## 2022-12-15 DIAGNOSIS — Z79899 Other long term (current) drug therapy: Secondary | ICD-10-CM | POA: Insufficient documentation

## 2022-12-15 DIAGNOSIS — F17290 Nicotine dependence, other tobacco product, uncomplicated: Secondary | ICD-10-CM | POA: Insufficient documentation

## 2022-12-15 DIAGNOSIS — I10 Essential (primary) hypertension: Secondary | ICD-10-CM | POA: Insufficient documentation

## 2022-12-15 HISTORY — PX: INGUINAL HERNIA REPAIR: SHX194

## 2022-12-15 SURGERY — REPAIR, HERNIA, INGUINAL, ADULT
Anesthesia: General | Site: Inguinal | Laterality: Right

## 2022-12-15 MED ORDER — DEXAMETHASONE SODIUM PHOSPHATE 10 MG/ML IJ SOLN
INTRAMUSCULAR | Status: AC
  Filled 2022-12-15: qty 1

## 2022-12-15 MED ORDER — LIDOCAINE 2% (20 MG/ML) 5 ML SYRINGE
INTRAMUSCULAR | Status: DC | PRN
  Administered 2022-12-15: 40 mg via INTRAVENOUS

## 2022-12-15 MED ORDER — ROCURONIUM BROMIDE 10 MG/ML (PF) SYRINGE
PREFILLED_SYRINGE | INTRAVENOUS | Status: DC | PRN
  Administered 2022-12-15: 50 mg via INTRAVENOUS
  Administered 2022-12-15: 10 mg via INTRAVENOUS

## 2022-12-15 MED ORDER — ORAL CARE MOUTH RINSE: 15.0000 mL | Freq: Once | OROMUCOSAL | Status: DC

## 2022-12-15 MED ORDER — SUGAMMADEX SODIUM 200 MG/2ML IV SOLN
INTRAVENOUS | Status: DC | PRN
  Administered 2022-12-15: 118 mg via INTRAVENOUS

## 2022-12-15 MED ORDER — BUPIVACAINE HCL (300 MG DOSE) 3 X 100 MG IL IMPL
DRUG_IMPLANT | Status: DC | PRN
  Administered 2022-12-15: 300 mg

## 2022-12-15 MED ORDER — ONDANSETRON HCL 4 MG/2ML IJ SOLN: 4.0000 mg | Freq: Once | INTRAMUSCULAR | Status: DC | PRN

## 2022-12-15 MED ORDER — CEFAZOLIN SODIUM-DEXTROSE 2-4 GM/100ML-% IV SOLN
2.0000 g | INTRAVENOUS | Status: AC
  Administered 2022-12-15: 2 g via INTRAVENOUS
  Filled 2022-12-15: qty 100

## 2022-12-15 MED ORDER — LACTATED RINGERS IV SOLN: INTRAVENOUS | Status: DC

## 2022-12-15 MED ORDER — ACETAMINOPHEN 10 MG/ML IV SOLN
INTRAVENOUS | Status: DC | PRN
  Administered 2022-12-15: 1000 mg via INTRAVENOUS

## 2022-12-15 MED ORDER — FENTANYL CITRATE (PF) 250 MCG/5ML IJ SOLN
INTRAMUSCULAR | Status: AC
  Filled 2022-12-15: qty 5

## 2022-12-15 MED ORDER — CHLORHEXIDINE GLUCONATE CLOTH 2 % EX PADS: 6.0000 | MEDICATED_PAD | Freq: Once | CUTANEOUS | Status: DC

## 2022-12-15 MED ORDER — ONDANSETRON HCL 4 MG/2ML IJ SOLN
INTRAMUSCULAR | Status: AC
  Filled 2022-12-15: qty 2

## 2022-12-15 MED ORDER — ROCURONIUM BROMIDE 10 MG/ML (PF) SYRINGE
PREFILLED_SYRINGE | INTRAVENOUS | Status: AC
  Filled 2022-12-15: qty 10

## 2022-12-15 MED ORDER — ACETAMINOPHEN 500 MG PO TABS: 1000.0000 mg | ORAL_TABLET | Freq: Four times a day (QID) | ORAL | 0 refills | Status: AC

## 2022-12-15 MED ORDER — CHLORHEXIDINE GLUCONATE 0.12 % MT SOLN: 15.0000 mL | Freq: Once | OROMUCOSAL | Status: DC

## 2022-12-15 MED ORDER — DOCUSATE SODIUM 100 MG PO CAPS: 100.0000 mg | ORAL_CAPSULE | Freq: Two times a day (BID) | ORAL | 2 refills | Status: DC

## 2022-12-15 MED ORDER — ONDANSETRON HCL 4 MG/2ML IJ SOLN
INTRAMUSCULAR | Status: DC | PRN
  Administered 2022-12-15: 4 mg via INTRAVENOUS

## 2022-12-15 MED ORDER — HYDROMORPHONE HCL 1 MG/ML IJ SOLN: 0.2500 mg | INTRAMUSCULAR | Status: DC | PRN

## 2022-12-15 MED ORDER — OXYCODONE HCL 5 MG PO TABS: 5.0000 mg | ORAL_TABLET | Freq: Four times a day (QID) | ORAL | 0 refills | Status: DC | PRN

## 2022-12-15 MED ORDER — ACETAMINOPHEN 10 MG/ML IV SOLN
INTRAVENOUS | Status: AC
  Filled 2022-12-15: qty 100

## 2022-12-15 MED ORDER — DEXAMETHASONE SODIUM PHOSPHATE 10 MG/ML IJ SOLN
INTRAMUSCULAR | Status: DC | PRN
  Administered 2022-12-15: 5 mg via INTRAVENOUS

## 2022-12-15 MED ORDER — FENTANYL CITRATE (PF) 250 MCG/5ML IJ SOLN
INTRAMUSCULAR | Status: DC | PRN
  Administered 2022-12-15 (×2): 25 ug via INTRAVENOUS
  Administered 2022-12-15: 50 ug via INTRAVENOUS
  Administered 2022-12-15: 25 ug via INTRAVENOUS

## 2022-12-15 MED ORDER — LIDOCAINE HCL (PF) 2 % IJ SOLN
INTRAMUSCULAR | Status: AC
  Filled 2022-12-15: qty 5

## 2022-12-15 MED ORDER — PROPOFOL 10 MG/ML IV BOLUS
INTRAVENOUS | Status: AC
  Filled 2022-12-15: qty 20

## 2022-12-15 MED ORDER — SODIUM CHLORIDE 0.9 % IR SOLN
Status: DC | PRN
  Administered 2022-12-15: 1000 mL

## 2022-12-15 MED ORDER — PROPOFOL 10 MG/ML IV BOLUS
INTRAVENOUS | Status: DC | PRN
  Administered 2022-12-15: 120 mg via INTRAVENOUS

## 2022-12-15 MED ORDER — BUPIVACAINE HCL (300 MG DOSE) 3 X 100 MG IL IMPL
DRUG_IMPLANT | Status: AC
  Filled 2022-12-15: qty 100

## 2022-12-15 SURGICAL SUPPLY — 29 items
ADH SKN CLS APL DERMABOND .7 (GAUZE/BANDAGES/DRESSINGS) ×1
CLOTH BEACON ORANGE TIMEOUT ST (SAFETY) ×1 IMPLANT
COVER LIGHT HANDLE STERIS (MISCELLANEOUS) ×2 IMPLANT
DERMABOND ADVANCED .7 DNX12 (GAUZE/BANDAGES/DRESSINGS) ×1 IMPLANT
DRAIN PENROSE 0.5X18 (DRAIN) ×1 IMPLANT
ELECT REM PT RETURN 9FT ADLT (ELECTROSURGICAL) ×1
ELECTRODE REM PT RTRN 9FT ADLT (ELECTROSURGICAL) ×1 IMPLANT
GAUZE SPONGE 4X4 12PLY STRL (GAUZE/BANDAGES/DRESSINGS) ×1 IMPLANT
GLOVE BIO SURGEON STRL SZ7 (GLOVE) IMPLANT
GLOVE BIOGEL PI IND STRL 6.5 (GLOVE) ×1 IMPLANT
GLOVE BIOGEL PI IND STRL 7.0 (GLOVE) ×2 IMPLANT
GLOVE BIOGEL PI IND STRL 7.5 (GLOVE) IMPLANT
GLOVE SURG SS PI 6.5 STRL IVOR (GLOVE) ×2 IMPLANT
GOWN STRL REUS W/TWL LRG LVL3 (GOWN DISPOSABLE) ×3 IMPLANT
INST SET MINOR GENERAL (KITS) ×1 IMPLANT
KIT TURNOVER KIT A (KITS) ×1 IMPLANT
MANIFOLD NEPTUNE II (INSTRUMENTS) ×1 IMPLANT
MESH PRE-SHAPE KEYHOLE 1.8X4 (Mesh General) IMPLANT
NS IRRIG 1000ML POUR BTL (IV SOLUTION) ×1 IMPLANT
PACK MINOR (CUSTOM PROCEDURE TRAY) ×1 IMPLANT
PAD ARMBOARD 7.5X6 YLW CONV (MISCELLANEOUS) ×1 IMPLANT
SET BASIN LINEN APH (SET/KITS/TRAYS/PACK) ×1 IMPLANT
SOL PREP PROV IODINE SCRUB 4OZ (MISCELLANEOUS) ×1 IMPLANT
SUT MNCRL AB 4-0 PS2 18 (SUTURE) ×1 IMPLANT
SUT NOVA NAB GS-22 2 2-0 T-19 (SUTURE) IMPLANT
SUT VIC AB 2-0 CT1 27 (SUTURE) ×1
SUT VIC AB 2-0 CT1 TAPERPNT 27 (SUTURE) ×1 IMPLANT
SUT VIC AB 3-0 SH 27 (SUTURE) ×2
SUT VIC AB 3-0 SH 27X BRD (SUTURE) ×1 IMPLANT

## 2022-12-15 NOTE — Progress Notes (Signed)
[facility]  Spoke with the patient in PACU.  I explained that he tolerated the procedure without difficulty.  He has dissolvable stitches under the skin with overlying skin glue.  This will flake off in 10 to 14 days.  I discharged him home with a prescription for narcotic pain medication that they should take as needed for pain.  I also want him taking scheduled Tylenol.  If they take the narcotic pain medication, they should take a stool softener as well.  The patient will follow-up with me in 2 weeks.  All questions were answered to his expressed satisfaction.  Graciella Freer, DO [facility] [address] [place], [place] [address] [phone] (office)

## 2022-12-15 NOTE — Interval H&P Note (Signed)
History and Physical Interval Note:  12/15/2022 8:37 AM  Gary Hood  has presented today for surgery, with the diagnosis of RIGHT INGUINAL HERNIA.  The various methods of treatment have been discussed with the patient and family. After consideration of risks, benefits and other options for treatment, the patient has consented to  Procedure(s): HERNIA REPAIR INGUINAL ADULT W/ MESH (Right) as a surgical intervention.  The patient's history has been reviewed, patient examined, no change in status, stable for surgery.  I have reviewed the patient's chart and labs.  Questions were answered to the patient's satisfaction.     [place]

## 2022-12-15 NOTE — Anesthesia Postprocedure Evaluation (Signed)
Anesthesia Post Note  Patient: Gary Hood  Procedure(s) Performed: HERNIA REPAIR INGUINAL ADULT W/ MESH (Right: Inguinal)  Patient location during evaluation: Phase II Anesthesia Type: General Level of consciousness: awake and alert and oriented Pain management: pain level controlled Vital Signs Assessment: post-procedure vital signs reviewed and stable Respiratory status: spontaneous breathing, nonlabored ventilation and respiratory function stable Cardiovascular status: blood pressure returned to baseline and stable Postop Assessment: no apparent nausea or vomiting Anesthetic complications: no  No notable events documented.   Last Vitals:  Vitals:   12/15/22 1128 12/15/22 1140  BP: (!) 153/71 (!) 145/89  Pulse: 66 69  Resp: 16 17  Temp:  36.6 C  SpO2: 98% 98%    Last Pain:  Vitals:   12/15/22 1115  PainSc: 4                  [name] [name]

## 2022-12-15 NOTE — Anesthesia Procedure Notes (Signed)
Procedure Name: Intubation Date/Time: 12/15/2022 8:50 AM  Performed by: Maude Leriche, CRNAPre-anesthesia Checklist: Patient identified, Emergency Drugs available, Suction available and Patient being monitored Patient Re-evaluated:Patient Re-evaluated prior to induction Oxygen Delivery Method: Circle system utilized Preoxygenation: Pre-oxygenation with 100% oxygen Induction Type: IV induction Ventilation: Mask ventilation without difficulty Laryngoscope Size: Miller and 2 Grade View: Grade I Tube type: Oral Tube size: 7.5 mm Number of attempts: 1 Airway Equipment and Method: Stylet and Bite block Placement Confirmation: ETT inserted through vocal cords under direct vision, positive ETCO2 and breath sounds checked- equal and bilateral Secured at: 23 cm Tube secured with: Tape Dental Injury: Teeth and Oropharynx as per pre-operative assessment

## 2022-12-15 NOTE — Anesthesia Preprocedure Evaluation (Signed)
Anesthesia Evaluation  Patient identified by MRN, date of birth, ID band Patient awake    Reviewed: Allergy & Precautions, H&P , NPO status , Patient's Chart, lab work & pertinent test results  Airway Mallampati: III  TM Distance: >3 FB Neck ROM: Full    Dental  (+) Dental Advisory Given, Partial Lower, Partial Upper Crowns :   Pulmonary former smoker   Pulmonary exam normal breath sounds clear to auscultation       Cardiovascular hypertension, Pt. on medications Normal cardiovascular exam Rhythm:Regular Rate:Normal     Neuro/Psych negative neurological ROS  negative psych ROS   GI/Hepatic negative GI ROS, Neg liver ROS,,,  Endo/Other  negative endocrine ROS    Renal/GU negative Renal ROS  negative genitourinary   Musculoskeletal negative musculoskeletal ROS (+)    Abdominal   Peds negative pediatric ROS (+)  Hematology negative hematology ROS (+)   Anesthesia Other Findings   Reproductive/Obstetrics negative OB ROS                             Anesthesia Physical Anesthesia Plan  ASA: 2  Anesthesia Plan: General   Post-op Pain Management: Dilaudid IV   Induction: Intravenous  PONV Risk Score and Plan: 3 and Ondansetron and Dexamethasone  Airway Management Planned: Oral ETT  Additional Equipment:   Intra-op Plan:   Post-operative Plan: Extubation in OR  Informed Consent: I have reviewed the patients History and Physical, chart, labs and discussed the procedure including the risks, benefits and alternatives for the proposed anesthesia with the patient or authorized representative who has indicated his/her understanding and acceptance.     Dental advisory given  Plan Discussed with: CRNA and Surgeon  Anesthesia Plan Comments:        Anesthesia Quick Evaluation

## 2022-12-15 NOTE — Discharge Instructions (Signed)
Ambulatory Surgery Discharge Instructions  General Anesthesia or Sedation Do not drive or operate heavy machinery for 24 hours.  Do not consume alcohol, tranquilizers, sleeping medications, or any non-prescribed medications for 24 hours. Do not make important decisions or sign any important papers in the next 24 hours. You should have someone with you tonight at home.  Activity  You are advised to go directly home from the hospital.  Restrict your activities and rest for a day.  Resume light activity tomorrow. No heavy lifting over 10 lbs or strenuous exercise.  Fluids and Diet Begin with clear liquids, bouillon, dry toast, soda crackers.  If not nauseated, you may go to a regular diet when you desire.  Greasy and spicy foods are not advised.  Medications  If you have not had a bowel movement in 24 hours, take 2 tablespoons over the counter Milk of mag.             You May resume your blood thinners tomorrow (Aspirin, coumadin, or other).  You are being discharged with prescriptions for Opioid/Narcotic Medications: There are some specific considerations for these medications that you should know. Opioid Meds have risks & benefits. Addiction to these meds is always a concern with prolonged use Take medication only as directed Do not drive while taking narcotic pain medication Do not crush tablets or capsules Do not use a different container than medication was dispensed in Lock the container of medication in a cool, dry place out of reach of children and pets. Opioid medication can cause addiction Do not share with anyone else (this is a felony) Do not store medications for future use. Dispose of them properly.     Disposal:  Find a [place] household drug take back site near you.  If you can't get to a drug take back site, use the recipe below as a last resort to dispose of expired, unused or unwanted drugs. Disposal  (Do not dispose chemotherapy drugs this way, talk to your  prescribing doctor instead.) Step 1: Mix drugs (do not crush) with dirt, kitty litter, or used coffee grounds and add a small amount of water to dissolve any solid medications. Step 2: Seal drugs in plastic bag. Step 3: Place plastic bag in trash. Step 4: Take prescription container and scratch out personal information, then recycle or throw away.  Operative Site  You have a liquid bandage over your incisions, this will begin to flake off in about a week. Ok to shower tomorrow. Keep wound clean and dry. No baths or swimming. No lifting more than 10 pounds.  Contact Information: If you have questions or concerns, please call our office, [phone], Monday- Thursday 8AM-5PM and Friday 8AM-12Noon.  If it is after hours or on the weekend, please call [facility] Main Number, [phone], and ask to speak to the surgeon on call for Dr. Tuere Nwosu at [facility].   SPECIFIC COMPLICATIONS TO WATCH FOR: Inability to urinate Fever over 101? F by mouth Nausea and vomiting lasting longer than 24 hours. Pain not relieved by medication ordered Swelling around the operative site Increased redness, warmth, hardness, around operative area Numbness, tingling, or cold fingers or toes Blood -soaked dressing, (small amounts of oozing may be normal) Increasing and progressive drainage from surgical area or exam site  

## 2022-12-15 NOTE — Transfer of Care (Signed)
Immediate Anesthesia Transfer of Care Note  Patient: Gary Hood  Procedure(s) Performed: HERNIA REPAIR INGUINAL ADULT W/ MESH (Right: Inguinal)  Patient Location: PACU  Anesthesia Type:General  Level of Consciousness: awake, alert , and oriented  Airway & Oxygen Therapy: Patient Spontanous Breathing and Patient connected to nasal cannula oxygen  Post-op Assessment: Report given to RN, Post -op Vital signs reviewed and stable, Patient moving all extremities X 4, and Patient able to stick tongue midline  Post vital signs: Reviewed  Last Vitals:  Vitals Value Taken Time  BP 144/84   Temp 97.7   Pulse 72 12/15/22 1052  Resp 14 12/15/22 1052  SpO2 100 % 12/15/22 1052  Vitals shown include unvalidated device data.  Last Pain:  Vitals:   12/15/22 0734  PainSc: 3          Complications: No notable events documented.

## 2022-12-15 NOTE — Op Note (Signed)
[facility] Operative Note  12/15/22  Preoperative Diagnosis: Right inguinal hernia    Postoperative Diagnosis: Same   Procedure(s) Performed: Right inguinal hernia repair with mesh   Surgeon: Graciella Freer, DO    Assistants: Leeann Must, RNFA   Anesthesia: General endotracheal   Anesthesiologist: Denese Killings, MD    Specimens: None   Estimated Blood Loss: Minimal   Blood Replacement: None    Complications: None   Wound Class: Clean   Operative Indications: Patient is a 72 year old male who presents for open right inguinal hernia repair with mesh.  He has had the hernia present for ~9 months, and he is starting to have increasing pain associated with the hernia.  He is agreeable to surgery.  All risks and benefits of performing this procedure were discussed with the patient including pain, infection, bleeding, damage to the surrounding structures, and need for more procedures or surgery. The patient voiced understanding of the procedure, all questions were sought and answered, and consent was obtained.  Findings: Indirect hernia defect noted, weakening of inguinal floor   Procedure: The patient was taken to the operating room and placed supine. General endotracheal anesthesia was induced. Intravenous antibiotics were administered per protocol.  A time out was preformed verifying the correct patient, procedure, site, positioning and implants.  The right groin and scrotum were prepared and draped in the usual sterile fashion.   An incision was made in a natural skin crease between the pubic tubercle and the anterior superior iliac spine.  The incision was deepened with electrocautery through Scarpa's and Camper's fascia until the aponeurosis of the external oblique was encountered.  This was cleaned and the external ring was exposed.  An incision was made in the midportion of the external oblique aponeurosis in the direction of its fibers. The  ilioinguinal nerve was not identified.  Flaps of the external oblique were developed cephalad and inferiorly.    The cord was identified and it was gently dissented free at the pubic tubercle and encircled with a Penrose drain.  Attention was then directed at the anteromedial aspect of the cord, where an indirect hernia sac was identified.  The sac was carefully dissected free from the cord down to the level of the internal ring.  The vas and testicular vessels were identified and protected from harm.  Once the sac was dissected free from the cords, the Penrose was placed around the cord which was retracted inferiorly out of the field of view.  The sac was entered with no contents noted.  The hernia sac was suture ligated and reduced into the abdomen. The hernia was reduced into the internal ring without difficult.  Attention was then turned to the floor of the canal, which was grossly weakened without any defined defect or sac.  The Bard Keyhole Mesh was sutured to the inguinal ligament inferiorly starting at the pubic tubercle using 2-0 Novafil interrupted sutures.  The mesh was sutured superiorly to the conjoint tendon using 2-0 Novafil interrupted sutures.  Care was taken to ensure the mesh was placed in a relaxed fashion to avoid excessive tension and no neurovascular structures were caught in the repair.  Laterally the tails of the mesh were crossed and the internal ring was recreated, allowing for passage of cords without tension.   Hemostasis was adequate.  The Penrose was removed.  The external oblique aponeurosis was closed with a 2-0 Vicryl suture in a running fashion, taking care to not catch the ilioinguinal  nerve in the suture line.  Scarpa's fashion was closed with 3-0 Vicryl suture in a running fashion.  The dermis was closed with interrupted 3-0 Vicryl sutures. The skin was closed with a subcuticular 4-0 Monocryl suture.  Dermabond was applied.  Xaracoll was inserted within all layers of  closure.   The testis was gently pulled down into its anatomic position in the scrotum.  The patient tolerated the procedure well and was taken to the PACU in stable condition. All counts were correct at the end of the case.        Graciella Freer, DO  [facility] [address] [place], [place] [address] [phone] (office)

## 2022-12-22 ENCOUNTER — Encounter (HOSPITAL_COMMUNITY): Payer: Self-pay | Admitting: Surgery

## 2022-12-30 ENCOUNTER — Ambulatory Visit (INDEPENDENT_AMBULATORY_CARE_PROVIDER_SITE_OTHER): Payer: Non-veteran care | Admitting: Surgery

## 2022-12-30 ENCOUNTER — Encounter: Payer: Self-pay | Admitting: Surgery

## 2022-12-30 VITALS — BP 148/85 | HR 92 | Temp 98.1°F | Resp 16 | Ht 68.0 in | Wt 131.0 lb

## 2022-12-30 DIAGNOSIS — Z09 Encounter for follow-up examination after completed treatment for conditions other than malignant neoplasm: Secondary | ICD-10-CM

## 2022-12-30 NOTE — Progress Notes (Signed)
[facility] Note   HPI:  72 y.o. Male presents to clinic for post-op follow-up status post open right inguinal hernia repair with mesh on 2/21.  He has been doing very well since the surgery.  He denies any significant pain.  He is moving his bowels without issue, tolerating diet without nausea and vomiting.  He denies any issues with his incision site, and denies fevers and chills.  Review of Systems:  All other review of systems: otherwise negative   Vital Signs:  BP (!) 148/85   Pulse 92   Temp 98.1 F (36.7 C) (Oral)   Resp 16   Ht '5\' 8"'$  (1.727 m)   Wt 131 lb (59.4 kg)   SpO2 96%   BMI 19.92 kg/m    Physical Exam:  Physical Exam Vitals reviewed.  Constitutional:      Appearance: Normal appearance.  Musculoskeletal:     Comments: Right groin healing well with Dermabond over incision, mild swelling underlying incision  Neurological:     Mental Status: He is alert.     Laboratory studies: None   Imaging:  None   Assessment:  72 y.o. yo Male for follow-up status post open right inguinal hernia repair with mesh on 2/21.  Plan:  -Patient overall doing well from a postoperative standpoint.  Tolerating a diet, moving his bowels, and pain adequately controlled -Advised him to apply antibiotic ointment to his incision site prior to showering to help get the remaining skin glue off -Advised him to hold off on any heavy lifting for an additional 2 weeks, for total of 4 weeks after surgery -Advised him to ice his groin in the evening to help reduce swelling -Follow up as needed  All of the above recommendations were discussed with the patient, and all of patient's questions were answered to his expressed satisfaction.  Graciella Freer, DO [facility] [address] [place], [place] [phone] (office)

## 2023-05-31 ENCOUNTER — Emergency Department (HOSPITAL_COMMUNITY): Payer: No Typology Code available for payment source

## 2023-05-31 ENCOUNTER — Other Ambulatory Visit: Payer: Self-pay

## 2023-05-31 ENCOUNTER — Emergency Department (HOSPITAL_COMMUNITY)
Admission: EM | Admit: 2023-05-31 | Discharge: 2023-05-31 | Disposition: A | Discharge status: Home / Self Care | Payer: No Typology Code available for payment source | Attending: Student | Admitting: Student

## 2023-05-31 ENCOUNTER — Encounter (HOSPITAL_COMMUNITY): Payer: Self-pay | Admitting: Emergency Medicine

## 2023-05-31 DIAGNOSIS — I2699 Other pulmonary embolism without acute cor pulmonale: Secondary | ICD-10-CM | POA: Diagnosis not present

## 2023-05-31 DIAGNOSIS — I10 Essential (primary) hypertension: Secondary | ICD-10-CM | POA: Diagnosis not present

## 2023-05-31 DIAGNOSIS — Z85828 Personal history of other malignant neoplasm of skin: Secondary | ICD-10-CM | POA: Diagnosis not present

## 2023-05-31 DIAGNOSIS — R319 Hematuria, unspecified: Secondary | ICD-10-CM | POA: Insufficient documentation

## 2023-05-31 DIAGNOSIS — Z87891 Personal history of nicotine dependence: Secondary | ICD-10-CM | POA: Insufficient documentation

## 2023-05-31 HISTORY — DX: Other pulmonary embolism without acute cor pulmonale: I26.99

## 2023-05-31 LAB — CBC WITH DIFFERENTIAL/PLATELET
Abs Immature Granulocytes: 0.08 10*3/uL — ABNORMAL HIGH (ref 0.00–0.07)
Basophils Absolute: 0.1 10*3/uL (ref 0.0–0.1)
Basophils Relative: 1 %
Eosinophils Absolute: 0.3 10*3/uL (ref 0.0–0.5)
Eosinophils Relative: 2 %
HCT: 39 % (ref 39.0–52.0)
Hemoglobin: 12.9 g/dL — ABNORMAL LOW (ref 13.0–17.0)
Immature Granulocytes: 1 %
Lymphocytes Relative: 20 %
Lymphs Abs: 2 10*3/uL (ref 0.7–4.0)
MCH: 32.6 pg (ref 26.0–34.0)
MCHC: 33.1 g/dL (ref 30.0–36.0)
MCV: 98.5 fL (ref 80.0–100.0)
Monocytes Absolute: 1 10*3/uL (ref 0.1–1.0)
Monocytes Relative: 9 %
Neutro Abs: 6.9 10*3/uL (ref 1.7–7.7)
Neutrophils Relative %: 67 %
Platelets: 357 10*3/uL (ref 150–400)
RBC: 3.96 MIL/uL — ABNORMAL LOW (ref 4.22–5.81)
RDW: 12.6 % (ref 11.5–15.5)
WBC: 10.3 10*3/uL (ref 4.0–10.5)
nRBC: 0 % (ref 0.0–0.2)

## 2023-05-31 LAB — BASIC METABOLIC PANEL
Anion gap: 11 (ref 5–15)
BUN: 16 mg/dL (ref 8–23)
CO2: 27 mmol/L (ref 22–32)
Calcium: 8.8 mg/dL — ABNORMAL LOW (ref 8.9–10.3)
Chloride: 101 mmol/L (ref 98–111)
Creatinine, Ser: 0.72 mg/dL (ref 0.61–1.24)
GFR, Estimated: >60 mL/min (ref >60)
Glucose, Bld: 104 mg/dL — ABNORMAL HIGH (ref 70–99)
Potassium: 3.4 mmol/L — ABNORMAL LOW (ref 3.5–5.1)
Sodium: 139 mmol/L (ref 135–145)

## 2023-05-31 LAB — URINALYSIS, ROUTINE W REFLEX MICROSCOPIC
Bacteria, UA: NONE SEEN
Bilirubin Urine: NEGATIVE
Glucose, UA: NEGATIVE mg/dL
Ketones, ur: NEGATIVE mg/dL
Leukocytes,Ua: NEGATIVE
Nitrite: NEGATIVE
Protein, ur: 100 mg/dL — AB
RBC / HPF: >50 RBC/hpf (ref 0–5)
Specific Gravity, Urine: 1.02 (ref 1.005–1.030)
pH: 5 (ref 5.0–8.0)

## 2023-05-31 MED ORDER — TAMSULOSIN HCL 0.4 MG PO CAPS: 0.4000 mg | ORAL_CAPSULE | Freq: Every day | ORAL | 0 refills | Status: DC

## 2023-05-31 MED ORDER — IOHEXOL 300 MG/ML  SOLN
100.0000 mL | Freq: Once | INTRAMUSCULAR | Status: AC | PRN
  Administered 2023-05-31: 100 mL via INTRAVENOUS

## 2023-05-31 NOTE — ED Provider Notes (Signed)
[place] EMERGENCY DEPARTMENT AT [facility] Provider Note  CSN: [id number] Arrival date & time: 05/31/23 1144  Chief Complaint(s) Hematuria  HPI Gary Hood is a 73 y.o. male with PMH recent hospital discharge from the [place] for bilateral pulmonary emboli on Eliquis nephrolithiasis who presents emergency department for evaluation of hematuria.  Started to notice significant gross hematuria today.  Denies flank pain, abdominal pain, chest pain, shortness of breath, headache, fever or other systemic symptoms.  Has been compliant with his Eliquis.  Denies weight loss or night sweats.  Does endorse previous history of smoking.   Past Medical History Past Medical History:  Diagnosis Date   Cancer [facility])    skin cancer   History of kidney stones    Hypertension    Pulmonary emboli (HCC)    Bilateral Lungs   Patient Active Problem List   Diagnosis Date Noted   Right inguinal hernia 12/15/2022   Home Medication(s) Prior to Admission medications   Medication Sig Start Date End Date Taking? Authorizing Provider  tamsulosin (FLOMAX) 0.4 MG CAPS capsule Take 1 capsule (0.4 mg total) by mouth daily for 7 days. 05/31/23 06/07/23 Yes Semira Stoltzfus, MD  docusate sodium (COLACE) 100 MG capsule Take 1 capsule (100 mg total) by mouth 2 (two) times daily. 12/15/22 12/15/23  Pappayliou, Santina Evans A, DO  loratadine (CLARITIN) 10 MG tablet Take 10 mg by mouth daily.    [provider]  oxyCODONE (ROXICODONE) 5 MG immediate release tablet Take 1 tablet (5 mg total) by mouth every 6 (six) hours as needed. Patient not taking: Reported on 12/30/2022 12/15/22   Pappayliou, Gustavus Messing, DO                                                                                                                                    Past Surgical History Past Surgical History:  Procedure Laterality Date   ABDOMINAL SURGERY     HERNIA REPAIR     INGUINAL HERNIA REPAIR Right 12/15/2022   Procedure: HERNIA  REPAIR INGUINAL ADULT W/ MESH;  Surgeon: Lewie Chamber, DO;  Location: [facility];  Service: General;  Laterality: Right;   KIDNEY STONE SURGERY     PROCTECTOMY     skin cancer removal     Family History No family history on file.  Social History Social History   Tobacco Use   Smoking status: Former   Smokeless tobacco: Never  Substance Use Topics   Alcohol use: Yes    Comment: "occasionally"   Drug use: No   Allergies Albumin (human), Aspirin, B12 folate [cobalamin combinations], Codeine, and Diclofenac  Review of Systems Review of Systems  Genitourinary:  Positive for hematuria.    Physical Exam Vital Signs  I have reviewed the triage vital signs BP (!) 140/80 (BP Location: Right Arm)   Pulse 90   Temp 98.2 F (36.8 C) (Oral)   Resp 18  Ht 5\' 8"  (1.727 m)   Wt 56.2 kg   SpO2 99%   BMI 18.85 kg/m   Physical Exam Constitutional:      General: He is not in acute distress.    Appearance: Normal appearance.  HENT:     Head: Normocephalic and atraumatic.     Nose: No congestion or rhinorrhea.  Eyes:     General:        Right eye: No discharge.        Left eye: No discharge.     Extraocular Movements: Extraocular movements intact.     Pupils: Pupils are equal, round, and reactive to light.  Cardiovascular:     Rate and Rhythm: Normal rate and regular rhythm.     Heart sounds: No murmur heard. Pulmonary:     Effort: No respiratory distress.     Breath sounds: No wheezing or rales.  Abdominal:     General: There is no distension.     Tenderness: There is no abdominal tenderness.  Musculoskeletal:        General: Normal range of motion.     Cervical back: Normal range of motion.  Skin:    General: Skin is warm and dry.  Neurological:     General: No focal deficit present.     Mental Status: He is alert.     ED Results and Treatments Labs (all labs ordered are listed, but only abnormal results are displayed) Labs Reviewed  CBC WITH  DIFFERENTIAL/PLATELET - Abnormal; Notable for the following components:      Result Value   RBC 3.96 (*)    Hemoglobin 12.9 (*)    Abs Immature Granulocytes 0.08 (*)    All other components within normal limits  BASIC METABOLIC PANEL - Abnormal; Notable for the following components:   Potassium 3.4 (*)    Glucose, Bld 104 (*)    Calcium 8.8 (*)    All other components within normal limits  URINALYSIS, ROUTINE W REFLEX MICROSCOPIC - Abnormal; Notable for the following components:   Color, Urine BROWN (*)    APPearance CLOUDY (*)    Hgb urine dipstick LARGE (*)    Protein, ur 100 (*)    All other components within normal limits                                                                                                                          Radiology CT ABDOMEN PELVIS W CONTRAST  Result Date: 05/31/2023 CLINICAL DATA:  Gross hematuria EXAM: CT ABDOMEN AND PELVIS WITH CONTRAST TECHNIQUE: Multidetector CT imaging of the abdomen and pelvis was performed using the standard protocol following bolus administration of intravenous contrast. RADIATION DOSE REDUCTION: This exam was performed according to the departmental dose-optimization program which includes automated exposure control, adjustment of the mA and/or kV according to patient size and/or use of iterative reconstruction technique. CONTRAST:  OMNIPAQUE IOHEXOL 300 MG/ML  SOLN COMPARISON:  CT abdomen and pelvis dated  12/17/2006 FINDINGS: Lower chest: Bilateral lower lobe linear/subsegmental opacities. No pleural effusion or pneumothorax demonstrated. Partially imaged heart size is normal. Hepatobiliary: No focal hepatic lesions. No intra or extrahepatic biliary ductal dilation. Normal gallbladder. Pancreas: No focal lesions or main ductal dilation. Spleen: Normal in size without focal abnormality. Adrenals/Urinary Tract: No adrenal nodules. Right interpolar renal cortical scarring. Mm nonobstructing distal left ureteral stone (2:56).  Additional bilateral nonobstructing stones. No hydronephrosis. Right interpolar simple/minimally complicated cyst measures 0.8 cm (2: 21). No specific follow-up imaging recommended. 5 mm hyperattenuating focus within the midline posterior bladder (7:65). Stomach/Bowel: Normal appearance of the stomach. No evidence of bowel wall thickening, distention, or inflammatory changes. Normal appendix. Vascular/Lymphatic: Aortic atherosclerosis. No enlarged abdominal or pelvic lymph nodes. Reproductive: Postsurgical changes of prostatectomy. Other: No free fluid, fluid collection, or free air. Musculoskeletal: No acute or abnormal lytic or blastic osseous lesions. Multilevel degenerative changes of the partially imaged thoracic and lumbar spine. Avascular necrosis of the left femoral head. No collapse. IMPRESSION: 1. Nonobstructing distal left ureteral stone measures 5 mm. Additional bilateral nonobstructing renal stones. No hydronephrosis. 2. A 5 mm hyperattenuating focus within the midline posterior bladder, which may represent a bladder stone, small neoplasm, or postsurgical changes related to prior prostatectomy. Recommend correlation with cystoscopy as clinically indicated. 3.  Aortic Atherosclerosis (ICD10-I70.0). Electronically Signed   By: Agustin Cree M.D.   On: 05/31/2023 14:52    Pertinent labs & imaging results that were available during my care of the patient were reviewed by me and considered in my medical decision making (see MDM for details).  Medications Ordered in ED Medications  iohexol (OMNIPAQUE) 300 MG/ML solution 100 mL (100 mLs Intravenous Contrast Given 05/31/23 1355)                                                                                                                                     Procedures Procedures  (including critical care time)  Medical Decision Making / ED Course   This patient presents to the ED for concern of hematuria, this involves an extensive number of  treatment options, and is a complaint that carries with it a high risk of complications and morbidity.  The differential diagnosis includes nephrolithiasis, cystitis, bladder malignancy, renal cell carcinoma  MDM: Patient seen the emerged part in for evaluation of hematuria.  Physical exam is largely unremarkable.  Laboratory evaluation with a hemoglobin of 12.9, potassium 3.4 but creatinine is stable at 0.72.  Urinalysis with gross hematuria but no evidence of infection.  CT abdomen pelvis showing a nonobstructing distal left ureteral stone approximately 5 mm with additional bilateral nonobstructing renal stones but no hydro.  There is also a 5 mm hyperattenuating focus in the posterior bladder which may represent a bladder stone, small neoplasm or surgical changes related to prior prostatectomy.  In the setting of gross hematuria, I did speak with the urologist on-call Dr. Margo Aye who will  help arrange outpatient follow-up in [place] and ultimate cystoscopy.  Is recommending straining the urine and starting Flomax.  Patient also has no current follow-up for his pulmonary embolism and they placed an outpatient referral to pulmonology.  At this time he does not meet inpatient criteria for admission he is stable discharge with outpatient follow-up.   Additional history obtained:  -External records from outside source obtained and reviewed including: Chart review including previous notes, labs, imaging, consultation notes   Lab Tests: -I ordered, reviewed, and interpreted labs.   The pertinent results include:   Labs Reviewed  CBC WITH DIFFERENTIAL/PLATELET - Abnormal; Notable for the following components:      Result Value   RBC 3.96 (*)    Hemoglobin 12.9 (*)    Abs Immature Granulocytes 0.08 (*)    All other components within normal limits  BASIC METABOLIC PANEL - Abnormal; Notable for the following components:   Potassium 3.4 (*)    Glucose, Bld 104 (*)    Calcium 8.8 (*)    All other  components within normal limits  URINALYSIS, ROUTINE W REFLEX MICROSCOPIC - Abnormal; Notable for the following components:   Color, Urine BROWN (*)    APPearance CLOUDY (*)    Hgb urine dipstick LARGE (*)    Protein, ur 100 (*)    All other components within normal limits       Imaging Studies ordered: I ordered imaging studies including CT abdomen pelvis I independently visualized and interpreted imaging. I agree with the radiologist interpretation   Medicines ordered and prescription drug management: Meds ordered this encounter  Medications   iohexol (OMNIPAQUE) 300 MG/ML solution 100 mL   tamsulosin (FLOMAX) 0.4 MG CAPS capsule    Sig: Take 1 capsule (0.4 mg total) by mouth daily for 7 days.    Dispense:  7 capsule    Refill:  0    -I have reviewed the patients home medicines and have made adjustments as needed  Critical interventions none  Consultations Obtained: I requested consultation with the urologist on-call Dr. Margo Aye,  and discussed lab and imaging findings as well as pertinent plan - they recommend: Outpatient follow-up, strain urine, Flomax   Cardiac Monitoring: The patient was maintained on a cardiac monitor.  I personally viewed and interpreted the cardiac monitored which showed an underlying rhythm of: NSR  Social Determinants of Health:  Factors impacting patients care include: Does not have current follow-up for pulmonary emboli   Reevaluation: After the interventions noted above, I reevaluated the patient and found that they have :stayed the same  Co morbidities that complicate the patient evaluation  Past Medical History:  Diagnosis Date   Cancer (HCC)    skin cancer   History of kidney stones    Hypertension    Pulmonary emboli (HCC)    Bilateral Lungs      Dispostion: I considered admission for this patient, but at this time he does not meet inpatient criteria for admission he is safe for discharge the patient follow-up.     Final  Clinical Impression(s) / ED Diagnoses Final diagnoses:  Hematuria, unspecified type  PE (pulmonary thromboembolism) (HCC)     @PCDICTATION @    Glendora Score, MD 05/31/23 2203

## 2023-05-31 NOTE — ED Triage Notes (Signed)
Pt was discharge from [place] on 05/22/23, started on Eliquis for DVT, started having blood in urine today.

## 2023-06-05 ENCOUNTER — Emergency Department (HOSPITAL_COMMUNITY): Payer: No Typology Code available for payment source

## 2023-06-05 ENCOUNTER — Emergency Department (HOSPITAL_COMMUNITY)
Admission: EM | Admit: 2023-06-05 | Discharge: 2023-06-05 | Disposition: A | Discharge status: Home / Self Care | Payer: No Typology Code available for payment source | Attending: Emergency Medicine | Admitting: Emergency Medicine

## 2023-06-05 ENCOUNTER — Other Ambulatory Visit: Payer: Self-pay

## 2023-06-05 DIAGNOSIS — R0602 Shortness of breath: Secondary | ICD-10-CM | POA: Insufficient documentation

## 2023-06-05 DIAGNOSIS — R42 Dizziness and giddiness: Secondary | ICD-10-CM | POA: Insufficient documentation

## 2023-06-05 DIAGNOSIS — Z87442 Personal history of urinary calculi: Secondary | ICD-10-CM | POA: Insufficient documentation

## 2023-06-05 DIAGNOSIS — R0789 Other chest pain: Secondary | ICD-10-CM | POA: Diagnosis not present

## 2023-06-05 DIAGNOSIS — R5383 Other fatigue: Secondary | ICD-10-CM | POA: Diagnosis not present

## 2023-06-05 DIAGNOSIS — R079 Chest pain, unspecified: Secondary | ICD-10-CM

## 2023-06-05 LAB — CBC
HCT: 36.3 % — ABNORMAL LOW (ref 39.0–52.0)
Hemoglobin: 11.7 g/dL — ABNORMAL LOW (ref 13.0–17.0)
MCH: 32.1 pg (ref 26.0–34.0)
MCHC: 32.2 g/dL (ref 30.0–36.0)
MCV: 99.7 fL (ref 80.0–100.0)
Platelets: 331 10*3/uL (ref 150–400)
RBC: 3.64 MIL/uL — ABNORMAL LOW (ref 4.22–5.81)
RDW: 12.7 % (ref 11.5–15.5)
WBC: 8.3 10*3/uL (ref 4.0–10.5)
nRBC: 0 % (ref 0.0–0.2)

## 2023-06-05 LAB — URINALYSIS, ROUTINE W REFLEX MICROSCOPIC
Bilirubin Urine: NEGATIVE
Glucose, UA: NEGATIVE mg/dL
Hgb urine dipstick: NEGATIVE
Ketones, ur: NEGATIVE mg/dL
Leukocytes,Ua: NEGATIVE
Nitrite: NEGATIVE
Protein, ur: NEGATIVE mg/dL
Specific Gravity, Urine: 1.018 (ref 1.005–1.030)
pH: 7 (ref 5.0–8.0)

## 2023-06-05 LAB — COMPREHENSIVE METABOLIC PANEL
ALT: 13 U/L (ref 0–44)
AST: 16 U/L (ref 15–41)
Albumin: 2.7 g/dL — ABNORMAL LOW (ref 3.5–5.0)
Alkaline Phosphatase: 55 U/L (ref 38–126)
Anion gap: 7 (ref 5–15)
BUN: 17 mg/dL (ref 8–23)
CO2: 27 mmol/L (ref 22–32)
Calcium: 8.3 mg/dL — ABNORMAL LOW (ref 8.9–10.3)
Chloride: 102 mmol/L (ref 98–111)
Creatinine, Ser: 0.73 mg/dL (ref 0.61–1.24)
GFR, Estimated: >60 mL/min (ref >60)
Glucose, Bld: 96 mg/dL (ref 70–99)
Potassium: 4 mmol/L (ref 3.5–5.1)
Sodium: 136 mmol/L (ref 135–145)
Total Bilirubin: 0.4 mg/dL (ref 0.3–1.2)
Total Protein: 6.1 g/dL — ABNORMAL LOW (ref 6.5–8.1)

## 2023-06-05 LAB — LIPASE, BLOOD: Lipase: 30 U/L (ref 11–51)

## 2023-06-05 LAB — TROPONIN I (HIGH SENSITIVITY)
Troponin I (High Sensitivity): 2 ng/L (ref <18)
Troponin I (High Sensitivity): 3 ng/L (ref <18)

## 2023-06-05 NOTE — ED Triage Notes (Signed)
Pt c/o chest left pain for a couple of days and pt began to get light headed and SOB today. Pt has kidney stones he is dealing with and PE's he was dx with a month ago. Pt is on RA except for ambulation. Pt is on blood thinners. REMS placed 20 G LAC

## 2023-06-05 NOTE — Discharge Instructions (Signed)
You were seen in the emergency department for feeling dizzy lightheaded today along with some right chest and shoulder pain.  You had blood work EKG chest x-ray urinalysis that did not show an obvious explanation for your symptoms.  Please continue your blood thinner and follow-up with your doctors at the [place].  Keep well-hydrated.  Return to the emergency department if any worsening or concerning symptoms.

## 2023-06-05 NOTE — ED Provider Notes (Signed)
[facility] EMERGENCY DEPARTMENT AT [facility] Provider Note   CSN: [id number] Arrival date & time: 06/05/23  1159     History  Chief Complaint  Patient presents with   Chest Pain    Gary Hood is a 72 y.o. male.  He is brought in by ambulance from home.  He said he woke up this morning feeling very weak and out of it.  He did not feel safe getting in the shower because he was afraid he might fall.  He had some discomfort in his right chest and his right scapula.  Felt a little short of breath.  He said he was diagnosed with a PE last month, admitted to [facility] and put on a blood thinner.  He has had some intermittent chest discomfort and back discomfort since then.  He was also here 5 days ago for hematuria.  Found to have a kidney stone.  He said the bleeding stopped for a few days and he is doing better from that standpoint.  No abdominal pain vomiting diarrhea dysuria.  No fevers or chills.  No cough.  The history is provided by the patient and the EMS personnel.  Chest Pain Pain location:  R chest Pain quality: burning   Pain radiates to:  R shoulder Pain severity:  Moderate Onset quality:  Gradual Timing:  Intermittent Relieved by:  None tried Worsened by:  Certain positions Ineffective treatments:  None tried Associated symptoms: dizziness, fatigue and shortness of breath   Associated symptoms: no abdominal pain, no cough, no diaphoresis, no nausea, no syncope and no vomiting        Home Medications Prior to Admission medications   Medication Sig Start Date End Date Taking? Authorizing Provider  docusate sodium (COLACE) 100 MG capsule Take 1 capsule (100 mg total) by mouth 2 (two) times daily. 12/15/22 12/15/23  Pappayliou, Santina Evans A, DO  loratadine (CLARITIN) 10 MG tablet Take 10 mg by mouth daily.    [provider]  oxyCODONE (ROXICODONE) 5 MG immediate release tablet Take 1 tablet (5 mg total) by mouth every 6 (six) hours as needed. Patient  not taking: Reported on 12/30/2022 12/15/22   Pappayliou, Santina Evans A, DO  tamsulosin (FLOMAX) 0.4 MG CAPS capsule Take 1 capsule (0.4 mg total) by mouth daily for 7 days. 05/31/23 06/07/23  Kommor, Madison, MD      Allergies    Albumin (human), Aspirin, B12 folate [cobalamin combinations], Codeine, and Diclofenac    Review of Systems   Review of Systems  Constitutional:  Positive for fatigue. Negative for diaphoresis.  Respiratory:  Positive for shortness of breath. Negative for cough.   Cardiovascular:  Positive for chest pain. Negative for syncope.  Gastrointestinal:  Negative for abdominal pain, nausea and vomiting.  Neurological:  Positive for dizziness.    Physical Exam Updated Vital Signs BP 131/76   Pulse 87   Temp 97.6 F (36.4 C) (Axillary)   Resp 19   Ht 5\' 8"  (1.727 m)   Wt 56.2 kg   SpO2 97%   BMI 18.85 kg/m  Physical Exam Vitals and nursing note reviewed.  Constitutional:      General: He is not in acute distress.    Appearance: He is well-developed.  HENT:     Head: Normocephalic and atraumatic.  Eyes:     Conjunctiva/sclera: Conjunctivae normal.  Cardiovascular:     Rate and Rhythm: Normal rate and regular rhythm.     Heart sounds: Normal heart sounds. No  murmur heard. Pulmonary:     Effort: Pulmonary effort is normal. No respiratory distress.     Breath sounds: Normal breath sounds.  Abdominal:     Palpations: Abdomen is soft.     Tenderness: There is no abdominal tenderness.  Musculoskeletal:        General: No swelling. Normal range of motion.     Cervical back: Neck supple.     Right lower leg: No tenderness. No edema.     Left lower leg: No tenderness. No edema.  Skin:    General: Skin is warm and dry.     Capillary Refill: Capillary refill takes less than 2 seconds.  Neurological:     General: No focal deficit present.     Mental Status: He is alert.     ED Results / Procedures / Treatments   Labs (all labs ordered are listed, but only  abnormal results are displayed) Labs Reviewed  CBC - Abnormal; Notable for the following components:      Result Value   RBC 3.64 (*)    Hemoglobin 11.7 (*)    HCT 36.3 (*)    All other components within normal limits  COMPREHENSIVE METABOLIC PANEL - Abnormal; Notable for the following components:   Calcium 8.3 (*)    Total Protein 6.1 (*)    Albumin 2.7 (*)    All other components within normal limits  URINALYSIS, ROUTINE W REFLEX MICROSCOPIC  LIPASE, BLOOD  TROPONIN I (HIGH SENSITIVITY)  TROPONIN I (HIGH SENSITIVITY)    EKG EKG Interpretation Date/Time:  Sunday June 05 2023 12:14:56 EDT Ventricular Rate:  84 PR Interval:  166 QRS Duration:  85 QT Interval:  370 QTC Calculation: 438 R Axis:   -35  Text Interpretation: Sinus rhythm Left axis deviation Low voltage, precordial leads Probable anteroseptal infarct, old nonspecific changes from prior 2/24 Confirmed by Meridee Score [phone]) on 06/05/2023 12:32:29 PM  Radiology DG Chest Port 1 View  Result Date: 06/05/2023 CLINICAL DATA:  Left chest pain and shortness of breath for several days. EXAM: PORTABLE CHEST 1 VIEW COMPARISON:  None Available. FINDINGS: The heart size and mediastinal contours are within normal limits. Pulmonary hyperinflation again seen, consistent with COPD. Both lungs are clear. The visualized skeletal structures are unremarkable. IMPRESSION: COPD. No active disease. Electronically Signed   By: Danae Orleans M.D.   On: 06/05/2023 13:08    Procedures Procedures    Medications Ordered in ED Medications - No data to display  ED Course/ Medical Decision Making/ A&P Clinical Course as of 06/05/23 1730  Sun Jun 05, 2023  1253 Chest x-ray interpreted by me as no acute infiltrate.  Awaiting radiology reading. [MB]  1531 She is ambulated in the department his saturations stayed good and he also had orthostatics done that were negative.  He did experience a little bit of right shoulder pain when he  ambulated.  Troponins have been negative normal white count clean urine.  Feel he is appropriate for outpatient follow-up.  He needs to schedule an appointment with the [place].  Return instructions discussed [MB]    Clinical Course User Index [MB] Terrilee Files, MD                                 Medical Decision Making Amount and/or Complexity of Data Reviewed Labs: ordered. Radiology: ordered.   This patient complains of right-sided chest pain, kidney pain, dizziness; this involves  an extensive number of treatment Options and is a complaint that carries with it a high risk of complications and morbidity. The differential includes musculoskeletal pain, renal colic, ACS, pneumonia, pneumothorax, PE, dehydration, hypovolemia  I ordered, reviewed and interpreted labs, which included CBC with slightly lower hemoglobin than prior, chemistries and LFTs unremarkable, troponins flat, urinalysis without signs of infection I ordered imaging studies which included chest x-ray and I independently    visualized and interpreted imaging which showed no acute findings Additional history obtained from EMS Previous records obtained and reviewed in epic including recent ED visit Cardiac monitoring reviewed, normal sinus rhythm Social determinants considered, no significant barriers Critical Interventions: None  After the interventions stated above, I reevaluated the patient and found patient's dizziness has improved.  He still having intermittent right-sided chest pain unclear etiology.  Vitals remained stable. Admission and further testing considered, no indications for admission or further workup at this time.  No evidence of sepsis.  Patient is on adequate treatment for PE and no evidence of ACS.  Commend close follow-up with his PCP.  Return instructions discussed         Final Clinical Impression(s) / ED Diagnoses Final diagnoses:  Nonspecific chest pain  Dizziness    Rx / DC Orders ED  Discharge Orders     None         Terrilee Files, MD 06/05/23 1733

## 2023-06-05 NOTE — ED Notes (Signed)
Aware we need urine sample. 

## 2023-06-07 ENCOUNTER — Encounter: Payer: Self-pay | Admitting: Urology

## 2023-06-07 ENCOUNTER — Ambulatory Visit (INDEPENDENT_AMBULATORY_CARE_PROVIDER_SITE_OTHER): Payer: Self-pay | Admitting: Urology

## 2023-06-07 VITALS — BP 121/70 | HR 90 | Ht 68.0 in | Wt 125.0 lb

## 2023-06-07 DIAGNOSIS — N329 Bladder disorder, unspecified: Secondary | ICD-10-CM

## 2023-06-07 DIAGNOSIS — N200 Calculus of kidney: Secondary | ICD-10-CM

## 2023-06-07 DIAGNOSIS — N401 Enlarged prostate with lower urinary tract symptoms: Secondary | ICD-10-CM

## 2023-06-07 DIAGNOSIS — R310 Gross hematuria: Secondary | ICD-10-CM

## 2023-06-07 MED ORDER — TAMSULOSIN HCL 0.4 MG PO CAPS: 0.4000 mg | ORAL_CAPSULE | Freq: Every day | ORAL | 3 refills | Status: AC

## 2023-06-07 NOTE — Progress Notes (Signed)
Assessment: 1. Nephrolithiasis   2. Gross hematuria   3. Benign localized prostatic hyperplasia with lower urinary tract symptoms (LUTS)   4. Lesion of bladder      Plan: Today had a long discussion with the patient regarding his nephrolithiasis with recent gross hematuria as well as lower urinary tract symptoms. Management for ureteral calculus discussed in detail today.  Patient is very clear that he would like to try passed the stone. Will restart tamsulosin 0.4 mg daily Continue to strain urine Follow-up 2 weeks for recheck.  If he has passed a stone at that time we will perform cystoscopy to assess bladder lesion.  Chief Complaint: kidney stones  History of Present Illness:  Gary Hood is a 72 y.o. male who is seen in consultation from [facility] for evaluation of GH and nephrolithiasis.  Patient also has longstanding LUTS. Patient has a long history of recurrent nephrolithiasis.  He has passed numerous stones and has also had several stone procedures endoscopically in the past. Patient was recently scheduled for endoscopic procedure at the [facility] [place] however he developed a PE requiring hospitalization and anticoagulation and the procedure was canceled.  After he began anticoagulation he developed gross hematuria and was seen in the emergency department.  Repeat scan at that time demonstrated a 5 mm left distal stone with no significant hydro-.  There was also noted to be a very small subcentimeter hyperattenuating focus in the midline posterior bladder which is indeterminate.  Patient states that his gross hematuria has now resolved.  He has been straining his urine and completed a 1 week course of tamsulosin and last night.  He has having no left-sided flank pain.   CT A/P with contrast 05/31/2023-- IMPRESSION: 1. Nonobstructing distal left ureteral stone measures 5 mm. Additional bilateral nonobstructing renal stones. No hydronephrosis. 2. A 5 mm hyperattenuating  focus within the midline posterior bladder, which may represent a bladder stone, small neoplasm, or postsurgical changes related to prior prostatectomy. Recommend correlation with cystoscopy as clinically indicated. 3.  Aortic Atherosclerosis (ICD10-I70.0).  Past Medical History:  Past Medical History:  Diagnosis Date   Cancer (HCC)    skin cancer   History of kidney stones    Hypertension    Pulmonary emboli (HCC)    Bilateral Lungs    Past Surgical History:  Past Surgical History:  Procedure Laterality Date   ABDOMINAL SURGERY     HERNIA REPAIR     INGUINAL HERNIA REPAIR Right 12/15/2022   Procedure: HERNIA REPAIR INGUINAL ADULT W/ MESH;  Surgeon: Lewie Chamber, DO;  Location: [facility];  Service: General;  Laterality: Right;   KIDNEY STONE SURGERY     PROCTECTOMY     skin cancer removal      Allergies:  Allergies  Allergen Reactions   Albumin (Human)    Aspirin     Blurred vision   B12 Folate [Cobalamin Combinations] Swelling    Swelling in lower limbs after injection   Codeine Itching   Diclofenac     Family History:  No family history on file.  Social History:  Social History   Tobacco Use   Smoking status: Former   Smokeless tobacco: Never  Substance Use Topics   Alcohol use: Yes    Comment: "occasionally"   Drug use: No    Review of symptoms:  Constitutional:  Negative for unexplained weight loss, night sweats, fever, chills ENT:  Negative for nose bleeds, sinus pain, painful swallowing CV:  Negative  for chest pain, shortness of breath, exercise intolerance, palpitations, loss of consciousness Resp:  Negative for cough, wheezing, shortness of breath GI:  Negative for nausea, vomiting, diarrhea, bloody stools GU:  Positives noted in HPI; otherwise negative for gross hematuria, dysuria, urinary incontinence Neuro:  Negative for seizures, poor balance, limb weakness, slurred speech Psych:  Negative for lack of energy, depression,  anxiety Endocrine:  Negative for polydipsia, polyuria, symptoms of hypoglycemia (dizziness, hunger, sweating) Hematologic:  Negative for anemia, purpura, petechia, prolonged or excessive bleeding, use of anticoagulants  Allergic:  Negative for difficulty breathing or choking as a result of exposure to anything; no shellfish allergy; no allergic response (rash/itch) to materials, foods  Physical exam: BP 121/70   Pulse 90   Ht 5\' 8"  (1.727 m)   Wt 125 lb (56.7 kg)   BMI 19.01 kg/m  GENERAL APPEARANCE:  Well appearing, well developed, well nourished, NAD   Results: UA today is remarkable for 3-10 RBC/hpf

## 2023-06-16 NOTE — Progress Notes (Signed)
Gary Hood, male    DOB: 1950/12/07   MRN: [id number]   Brief patient profile:  37  yowm VET  quit smoking 2010  s resp cc [facility]  dx PE at [facility] July 23rd d/c on 02 with new doe.    History of Present Illness  06/17/2023  Pulmonary/ 1st office eval/Gary Hood new start on Stiolto 2 puff each am  Chief Complaint  Patient presents with   Consult    Follow up.  Pulmonary embolism.  [facility] [place] [place]  Dyspnea:  improved from d/c but no regular walking  (was sob x 10 ft)  Cough: dry  sporadic  Sleep: flat bed/ one pillow - no resp cc  SABA use: bid - says written for q 4h not "as needed"    02 has for prn not using   No obvious day to day or daytime pattern/variability or assoc excess/ purulent sputum or mucus plugs or hemoptysis or cp or chest tightness, subjective wheeze or overt sinus or hb symptoms.     Also denies any obvious fluctuation of symptoms with weather or environmental changes or other aggravating or alleviating factors except as outlined above   No unusual exposure hx or h/o childhood pna/ asthma or knowledge of premature birth.  Current Allergies, Complete Past Medical History, Past Surgical History, Family History, and Social History were reviewed in [employer] record.  ROS  The following are not active complaints unless bolded Hoarseness, sore throat, dysphagia, dental problems, itching, sneezing,  nasal congestion or discharge of excess mucus or purulent secretions, ear ache,   fever, chills, sweats, unintended wt loss or wt gain, classically pleuritic or exertional cp,  orthopnea pnd or arm/hand swelling  or leg swelling, presyncope, palpitations, abdominal pain, anorexia, nausea, vomiting, diarrhea  or change in bowel habits or change in bladder habits, change in stools or change in urine, dysuria, hematuria,  rash, arthralgias, visual complaints, headache, numbness,  weakness or ataxia or problems with walking or coordination,  change in mood or  memory.             Outpatient Medications Prior to Visit  Medication Sig Dispense Refill   albuterol (VENTOLIN HFA) 108 (90 Base) MCG/ACT inhaler Inhale 2 puffs into the lungs every 4 (four) hours as needed for wheezing or shortness of breath.     apixaban (ELIQUIS) 5 MG TABS tablet Take by mouth.     tamsulosin (FLOMAX) 0.4 MG CAPS capsule Take 1 capsule (0.4 mg total) by mouth daily after supper. 90 capsule 3   Tiotropium Bromide-Olodaterol 2.5-2.5 MCG/ACT AERS Inhale 2 puffs into the lungs every morning.     docusate sodium (COLACE) 100 MG capsule Take 1 capsule (100 mg total) by mouth 2 (two) times daily. (Patient not taking: Reported on 06/07/2023) 60 capsule 2   loratadine (CLARITIN) 10 MG tablet Take 10 mg by mouth daily. (Patient not taking: Reported on 06/07/2023)     oxyCODONE (ROXICODONE) 5 MG immediate release tablet Take 1 tablet (5 mg total) by mouth every 6 (six) hours as needed. (Patient not taking: Reported on 12/30/2022) 20 tablet 0   polyethylene glycol powder (GLYCOLAX/MIRALAX) 17 GM/SCOOP powder Take 1 Container by mouth once. (Patient not taking: Reported on 06/17/2023)     No facility-administered medications prior to visit.    Past Medical History:  Diagnosis Date   Cancer (HCC)    skin cancer  History of kidney stones    Hypertension    Pulmonary emboli (HCC)    Bilateral Lungs      Objective:     BP 110/70 (BP Location: Left Arm, Patient Position: Sitting, Cuff Size: Normal)   Pulse 76   Temp 98 F (36.7 C) (Oral)   Ht 5\' 8"  (1.727 m)   Wt 135 lb 9.6 oz (61.5 kg)   SpO2 98%   BMI 20.62 kg/m   SpO2: 98 % elderly amb wm easily confused with details of care and answering every question "well they told me" instead of addressing questions re symptoms  HEENT : Oropharynx  clear   Nasal turbinates nl    NECK :  without  apparent JVD/ palpable Nodes/TM    LUNGS: no acc  muscle use,  Mild barrel  contour chest wall with bilateral  Distant bs s audible wheeze and  without cough on insp or exp maneuvers  and mild  Hyperresonant  to  percussion bilaterally     CV:  RRR  no s3 or murmur or increase in P2, and no edema   ABD:  soft and nontender with pos end  insp Hoover's  in the supine position.  No bruits or organomegaly appreciated   MS:  Nl gait/ ext warm without deformities Or obvious joint restrictions  calf tenderness, cyanosis or clubbing     SKIN: warm and dry without lesions    NEURO:  alert, approp, nl sensorium with  no motor or cerebellar deficits apparent.      I personally reviewed images and agree with radiology impression as follows:  CXR:   portable 06/05/23  COPD. No active disease.       Assessment     Gary Hood was seen today for consult.  DOE (dyspnea on exertion) Assessment & Plan: Labs pending for doe but note absence of desaturation so no 02 needed with activity   Orders: -     Brain natriuretic peptide; Future -     CBC with Differential/Platelet; Future -     D-dimer, quantitative; Future -     TSH; Future -     Sedimentation rate; Future  COPD GOLD ? group B Overview: Quit smoking 2010 / no pft's on file  - maint rx as of  06/17/2023  = stiolto 2 each am and prn saba   Assessment & Plan: Pt is Group B in terms of symptom/risk and laba/lama therefore appropriate rx at this point >>>  stiolto and approp saba:  Re SABA :  I spent extra time with pt today reviewing appropriate use of albuterol for prn use on exertion with the following points: 1) saba is for relief of sob that does not improve by walking a slower pace or resting but rather if the pt does not improve after trying this first. 2) If the pt is convinced, as many are, that saba helps recover from activity faster then it's easy to tell if this is the case by re-challenging : ie stop, take the inhaler, then p 5 minutes try the exact same activity (intensity of  workload) that just caused the symptoms and see if they are substantially diminished or not after saba 3) if there is an activity that reproducibly causes the symptoms, try the saba 15 min before the activity on alternate days   If in fact the saba really does help, then fine to continue to use it prn but advised may need to look closer at the  maintenance regimen (stiolto in this case)  being used to achieve better control of airways disease with exertion.   F/u 4-6weeks in [place]  - sooner if needed     Orders: -     Alpha-1-Antitrypsin Phenotyp; Future  Acute pulmonary embolism, unspecified pulmonary embolism type, unspecified whether acute cor pulmonale present [facility]) Overview: Onset May 17 2023 with neg B venous dopplers same date  and d/c on 02 > rx  - 06/17/2023   Walked on RA   x  2  lap(s) =  approx 300  ft  @ slow pace, stopped due to tired with lowest 02 sats 97% and no sob   Assessment & Plan: Responded nicely to DOAC ? Underlying  problem (likel occult ca) as there was no obvious provocation.  Studies do not support going on search for Ca as this would not be a sign of early dz and these is not obvious primary based on today's eval so rec  1) 6 m doac if tolerates then reduce to prophylactic dosing unless develops bleeding that cannot be easily addressed   2) f/u in office in [place] in 4 -6 weeks   with all meds in hand using a trust but verify approach to confirm accurate Medication  Reconciliation The principal here is that until we are certain that the  patients are doing what we've asked, it makes no sense to ask them to do more.   Each maintenance medication was reviewed in detail including emphasizing most importantly the difference between maintenance and prns and under what circumstances the prns are to be triggered using an action plan format where appropriate.  Total time for H and P, chart review, counseling, reviewing dpi/hfa/02/pulse ox device(s) , directly observing  portions of ambulatory 02 saturation study/ and generating customized AVS unique to this office visit / same day charting  > 60 min pt new to me                      Sandrea Hughs, MD Division of Pulmonary and Critical Care Medicine [facility]

## 2023-06-17 ENCOUNTER — Ambulatory Visit (INDEPENDENT_AMBULATORY_CARE_PROVIDER_SITE_OTHER): Payer: No Typology Code available for payment source | Admitting: Internal Medicine

## 2023-06-17 ENCOUNTER — Institutional Professional Consult (permissible substitution): Payer: No Typology Code available for payment source | Admitting: Internal Medicine

## 2023-06-17 ENCOUNTER — Encounter: Payer: Self-pay | Admitting: Internal Medicine

## 2023-06-17 VITALS — BP 110/70 | HR 76 | Temp 98.0°F | Ht 68.0 in | Wt 135.6 lb

## 2023-06-17 DIAGNOSIS — I2699 Other pulmonary embolism without acute cor pulmonale: Secondary | ICD-10-CM

## 2023-06-17 DIAGNOSIS — R0609 Other forms of dyspnea: Secondary | ICD-10-CM | POA: Diagnosis not present

## 2023-06-17 DIAGNOSIS — J449 Chronic obstructive pulmonary disease, unspecified: Secondary | ICD-10-CM | POA: Diagnosis not present

## 2023-06-17 LAB — SEDIMENTATION RATE: Sed Rate: 19 mm/hr (ref 0–20)

## 2023-06-17 LAB — CBC WITH DIFFERENTIAL/PLATELET
Basophils Absolute: 0.1 10*3/uL (ref 0.0–0.1)
Basophils Relative: 1.1 % (ref 0.0–3.0)
Eosinophils Absolute: 0.1 10*3/uL (ref 0.0–0.7)
Eosinophils Relative: 2.2 % (ref 0.0–5.0)
HCT: 39.6 % (ref 39.0–52.0)
Hemoglobin: 12.9 g/dL — ABNORMAL LOW (ref 13.0–17.0)
Lymphocytes Relative: 34.6 % (ref 12.0–46.0)
Lymphs Abs: 2.3 10*3/uL (ref 0.7–4.0)
MCHC: 32.7 g/dL (ref 30.0–36.0)
MCV: 98.4 fl (ref 78.0–100.0)
Monocytes Absolute: 0.7 10*3/uL (ref 0.1–1.0)
Monocytes Relative: 9.8 % (ref 3.0–12.0)
Neutro Abs: 3.5 10*3/uL (ref 1.4–7.7)
Neutrophils Relative %: 52.3 % (ref 43.0–77.0)
Platelets: 353 10*3/uL (ref 150.0–400.0)
RBC: 4.02 Mil/uL — ABNORMAL LOW (ref 4.22–5.81)
RDW: 13.6 % (ref 11.5–15.5)
WBC: 6.8 10*3/uL (ref 4.0–10.5)

## 2023-06-17 LAB — D-DIMER, QUANTITATIVE: D-Dimer, Quant: 0.48 ug{FEU}/mL (ref <0.50)

## 2023-06-17 LAB — BRAIN NATRIURETIC PEPTIDE: Pro B Natriuretic peptide (BNP): 34 pg/mL (ref 0.0–100.0)

## 2023-06-17 LAB — TSH: TSH: 4.53 u[IU]/mL (ref 0.35–5.50)

## 2023-06-17 NOTE — Patient Instructions (Addendum)
Plan A = Automatic = Always=    Stiolto 2 puffs each am   Plan B = Backup (to supplement plan A, not to replace it) Only use your albuterol inhaler as a rescue medication to be used if you can't catch your breath by resting or doing a relaxed purse lip breathing pattern.  - The less you use it, the better it will work when you need it. - Ok to use the inhaler up to 2 puffs  every 4 hours if you must but call for appointment if use goes up over your usual need - Don't leave home without it !!  (think of it like the starter fluid)   Make sure you check your oxygen saturation  AT  your highest level of activity (not after you stop)   to be sure it stays over 90% and adjust  02 flow upward to maintain this level if needed but remember to turn it back to previous settings when you stop (to conserve your supply).   Please remember to go to the lab department   for your tests - we will call you with the results when they are available.      Please schedule a follow up office visit in 4-6 weeks, call sooner if needed with all medications /inhalers/ solutions in hand so we can verify exactly what you are taking. This includes all medications from all doctors and over the counters - PLEASE separate them into two bags:  the ones you take automatically, no matter what, vs the ones you take just when you feel you need them "BAG #2 is UP TO YOU"  - this will really help [place] help you take your medications more effectively.   [facility]

## 2023-06-17 NOTE — Assessment & Plan Note (Addendum)
Responded nicely to DOAC ? Underlying  problem (likel occult ca) as there was no obvious provocation.  Studies do not support going on search for Ca as this would not be a sign of early dz and these is not obvious primary based on today's eval so rec  1) 6 m doac if tolerates then reduce to prophylactic dosing unless develops bleeding that cannot be easily addressed   2) f/u in office in [place] in 4 -6 weeks   with all meds in hand using a trust but verify approach to confirm accurate Medication  Reconciliation The principal here is that until we are certain that the  patients are doing what we've asked, it makes no sense to ask them to do more.   Each maintenance medication was reviewed in detail including emphasizing most importantly the difference between maintenance and prns and under what circumstances the prns are to be triggered using an action plan format where appropriate.  Total time for H and P, chart review, counseling, reviewing dpi/hfa/02/pulse ox device(s) , directly observing portions of ambulatory 02 saturation study/ and generating customized AVS unique to this office visit / same day charting  > 60 min pt new to me

## 2023-06-17 NOTE — Assessment & Plan Note (Addendum)
Labs pending for doe but note absence of desaturation so no 02 needed with activity

## 2023-06-17 NOTE — Assessment & Plan Note (Signed)
Pt is Group B in terms of symptom/risk and laba/lama therefore appropriate rx at this point >>>  stiolto and approp saba:  Re SABA :  I spent extra time with pt today reviewing appropriate use of albuterol for prn use on exertion with the following points: 1) saba is for relief of sob that does not improve by walking a slower pace or resting but rather if the pt does not improve after trying this first. 2) If the pt is convinced, as many are, that saba helps recover from activity faster then it's easy to tell if this is the case by re-challenging : ie stop, take the inhaler, then p 5 minutes try the exact same activity (intensity of workload) that just caused the symptoms and see if they are substantially diminished or not after saba 3) if there is an activity that reproducibly causes the symptoms, try the saba 15 min before the activity on alternate days   If in fact the saba really does help, then fine to continue to use it prn but advised may need to look closer at the maintenance regimen (stiolto in this case)  being used to achieve better control of airways disease with exertion.   F/u 4-6weeks in [place]  - sooner if needed

## 2023-06-23 ENCOUNTER — Other Ambulatory Visit: Payer: No Typology Code available for payment source | Admitting: Urology

## 2023-06-23 LAB — ALPHA-1-ANTITRYPSIN PHENOTYP: A-1 Antitrypsin: 120 mg/dL (ref 101–187)

## 2023-07-13 NOTE — Progress Notes (Signed)
Gary Hood, male    DOB: 09/12/1951   MRN: [id number]   Brief patient profile:  6  yowm VET  quit smoking 2010/MM  s resp cc [facility] pulmonary [facility] [place]  [facility] PE at [facility] July 23/2024  d/c on 02 with new doe.    History of Present Illness  06/17/2023  Pulmonary/ 1st office eval/Hamza Empson new start on Stiolto 2 puff each am  Chief Complaint  Patient presents with   Consult    Follow up.  Pulmonary embolism.  [facility] [place] [place]  Dyspnea:  improved from d/c but no regular walking  (was sob x 10 ft)  Cough: dry  sporadic  Sleep: flat bed/ one pillow - no resp cc  SABA use: bid - says written for q 4h not "as needed"    02 has for prn not using Rec Plan A = Automatic = Always=    Stiolto 2 puffs each am  Plan B = Backup (to supplement plan A, not to replace it) Only use your albuterol inhaler as a rescue medication Make sure you check your oxygen saturation  AT  your highest level of activity (not after you stop)   to be sure it stays over 90%  Please remember to go to the lab department   for your tests - we will call you with the results when they are available.     Please schedule a follow up office visit in 4-6 weeks, call sooner if needed with all medications /inhalers/ solutions in hand [place] [facility]   06/17/23   Eos 0.1  Apha One AT phenotype MM   level   120   07/15/2023  f/u ov/[place] office/Madina Galati re: doe/ s/p PE  maint on stiolto   Chief Complaint  Patient presents with   Follow-up    4 week follow up    Dyspnea:  mb is 600 ft incline to mb s stopping/ more tired than sob and no change off stiolto x 4 days one month prior to OV  / also slowed by bilateral calf pain over last several weeks = on both sides  Cough: minimal  Sleeping: no cough /sob   flat bed one pillow SABA use: no inhalers  02: prn/ not checking ex sagts      No obvious day to day or daytime variability or assoc excess/ purulent sputum or mucus  plugs or hemoptysis or cp or chest tightness, subjective wheeze or overt sinus or hb symptoms.    Also denies any obvious fluctuation of symptoms with weather or environmental changes or other aggravating or alleviating factors except as outlined above   No unusual exposure hx or h/o childhood pna/ asthma or knowledge of premature birth.  Current Allergies, Complete Past Medical History, Past Surgical History, Family History, and Social History were reviewed in [employer] record.  ROS  The following are not active complaints unless bolded Hoarseness, sore throat, dysphagia, dental problems, itching, sneezing,  nasal congestion or discharge of excess mucus or purulent secretions, ear ache,   fever, chills, sweats, unintended wt loss or wt gain, classically pleuritic or exertional cp,  orthopnea pnd or arm/hand swelling  or leg swelling, presyncope, palpitations, abdominal pain, anorexia, nausea, vomiting, diarrhea  or change in bowel habits or change in bladder habits, change in stools or change in urine, dysuria, hematuria,  rash, arthralgias/myalgias, visual complaints, headache, numbness, weakness or ataxia or problems with walking or  coordination,  change in mood or  memory. No back pain or radicular features to bilateral leg pain  Current Meds  Medication Sig   albuterol (VENTOLIN HFA) 108 (90 Base) MCG/ACT inhaler Inhale 2 puffs into the lungs every 4 (four) hours as needed for wheezing or shortness of breath.   apixaban (ELIQUIS) 5 MG TABS tablet Take by mouth.   tamsulosin (FLOMAX) 0.4 MG CAPS capsule Take 1 capsule (0.4 mg total) by mouth daily after supper.   Tiotropium Bromide-Olodaterol 2.5-2.5 MCG/ACT AERS Inhale 2 puffs into the lungs every morning.             Past Medical History:  Diagnosis Date   Cancer [facility])    skin cancer   History of kidney stones    Hypertension    Pulmonary emboli (HCC)    Bilateral Lungs      Objective:     Wt  Readings from Last 3 Encounters:  07/15/23 142 lb 12.8 oz (64.8 kg)  06/17/23 135 lb 9.6 oz (61.5 kg)  06/07/23 125 lb (56.7 kg)      Vital signs reviewed  07/15/2023  - Note at rest 02 sats  96% on RA   General appearance:    amb elderly wm nad   HEENT : Oropharynx  clear  Nasal turbinates nl    NECK :  without  apparent JVD/ palpable Nodes/TM    LUNGS: no acc muscle use,  Mild barrel  contour chest wall with bilateral  Distant bs s audible wheeze and  without cough on insp or exp maneuvers  and mild  Hyperresonant  to  percussion bilaterally     CV:  RRR  no s3 or murmur or increase in P2, and no edema   ABD:  soft and nontender with pos end  insp Hoover's  in the supine position.  No bruits or organomegaly appreciated   MS:  Nl gait/ ext warm without deformities Or obvious joint restrictions  calf tenderness, cyanosis or clubbing - neg homan's bilaterally  , no evidence of compartment syndrome or bruing about the knees or calf     SKIN: warm and dry without lesions    NEURO:  alert, approp, nl sensorium with  no motor or cerebellar deficits apparent.               Assessment

## 2023-07-15 ENCOUNTER — Ambulatory Visit (INDEPENDENT_AMBULATORY_CARE_PROVIDER_SITE_OTHER): Payer: No Typology Code available for payment source | Admitting: Internal Medicine

## 2023-07-15 ENCOUNTER — Encounter: Payer: Self-pay | Admitting: Internal Medicine

## 2023-07-15 VITALS — BP 138/71 | HR 79 | Ht 68.0 in | Wt 142.8 lb

## 2023-07-15 DIAGNOSIS — J449 Chronic obstructive pulmonary disease, unspecified: Secondary | ICD-10-CM

## 2023-07-15 DIAGNOSIS — I2699 Other pulmonary embolism without acute cor pulmonale: Secondary | ICD-10-CM | POA: Diagnosis not present

## 2023-07-15 NOTE — Patient Instructions (Addendum)
Stop stiolto to see what difference it makes in your breathing when you go to mailbox and back and restart it if you notice you are  worse off it - return here if breathing   worse despite taking stiolto 2 puffs daily but bring all your medications with you as you did today   See your [facility] re muscle aches in legs

## 2023-07-15 NOTE — Assessment & Plan Note (Signed)
Onset May 17 2023 with neg B venous dopplers same date  and d/c on 02   - 06/17/2023   Walked on RA   x  2  lap(s) =  approx 300  ft  @ slow pace, stopped due to tired with lowest 02 sats 97% and no sob   Echo cardiogram not available and not apparent to me what his longterm risk factors may be but I would consider changing over to the lower (prophylactic) dose at 6 months if echocardiogram shows nl RV at baseline or now.   Discussed in detail all the  indications, usual  risks and alternatives  relative to the benefits with patient who agrees to proceed with Rx as outlined.      Pulmonary f/u is prn          Each maintenance medication was reviewed in detail including emphasizing most importantly the difference between maintenance and prns and under what circumstances the prns are to be triggered using an action plan format where appropriate.  Total time for H and P, chart review, counseling, reviewing hfa/02 device(s) and generating customized AVS unique to this office visit / same day charting = 30 min summary final f/u ov

## 2023-07-15 NOTE — Assessment & Plan Note (Addendum)
Quit smoking 2010 / no pft's on file  - CT chest 10/2006 "copd changes" - images not available - maint rx as of  06/17/2023  = stiolto 2 each am and prn saba  - 06/17/23   Eos 0.1     Apha One AT phenotype MM   level   120  - 07/15/2023 try off stiolto at pt request   Advised to use the mb walk as a guide to whether or not he feels he needs stiolto with exertion - if the mb walk becomes more difficult even on the stiolto advised to return to this clinic with pfts

## 2023-07-19 ENCOUNTER — Encounter (HOSPITAL_COMMUNITY): Payer: Self-pay | Admitting: *Deleted

## 2023-07-19 ENCOUNTER — Other Ambulatory Visit: Payer: Self-pay

## 2023-07-19 ENCOUNTER — Emergency Department (HOSPITAL_COMMUNITY)
Admission: EM | Admit: 2023-07-19 | Discharge: 2023-07-19 | Disposition: A | Discharge status: Home / Self Care | Payer: No Typology Code available for payment source | Attending: Emergency Medicine | Admitting: Emergency Medicine

## 2023-07-19 ENCOUNTER — Emergency Department (HOSPITAL_COMMUNITY): Payer: No Typology Code available for payment source

## 2023-07-19 DIAGNOSIS — Z7901 Long term (current) use of anticoagulants: Secondary | ICD-10-CM | POA: Diagnosis not present

## 2023-07-19 DIAGNOSIS — Z85828 Personal history of other malignant neoplasm of skin: Secondary | ICD-10-CM | POA: Insufficient documentation

## 2023-07-19 DIAGNOSIS — M79604 Pain in right leg: Secondary | ICD-10-CM | POA: Insufficient documentation

## 2023-07-19 DIAGNOSIS — I10 Essential (primary) hypertension: Secondary | ICD-10-CM | POA: Diagnosis not present

## 2023-07-19 DIAGNOSIS — R2241 Localized swelling, mass and lump, right lower limb: Secondary | ICD-10-CM | POA: Diagnosis present

## 2023-07-19 DIAGNOSIS — Z79899 Other long term (current) drug therapy: Secondary | ICD-10-CM | POA: Diagnosis not present

## 2023-07-19 NOTE — ED Notes (Signed)
Patient ambulatory to restroom with steady gait noted.  

## 2023-07-19 NOTE — ED Provider Notes (Addendum)
[facility] EMERGENCY DEPARTMENT AT [facility] Provider Note   CSN: [id number] Arrival date & time: 07/19/23  1211     History  Chief Complaint  Patient presents with   Leg Swelling    Gary Hood is a 72 y.o. male with history of hypertension, kidney stones, skin cancer, bilateral pulmonary embolisms on Eliquis, who presents to the emergency department for right lower leg swelling and pain.  Patient states that he recently started a new inhaler that his doctor put him on for COPD that he thinks caused his bilateral legs to swell.  The left leg has significantly decreased in size, but he does still have some swelling in the right lower leg around his ankle.  He states this is quite uncomfortable.  He follows at the [place], and was sent here with concern for DVT.  Patient not complaining of any chest pain or shortness of breath.  Reports he is compliant with his Eliquis.  HPI     Home Medications Prior to Admission medications   Medication Sig Start Date End Date Taking? Authorizing Provider  albuterol (VENTOLIN HFA) 108 (90 Base) MCG/ACT inhaler Inhale 2 puffs into the lungs every 4 (four) hours as needed for wheezing or shortness of breath. 05/21/23   [provider]  apixaban (ELIQUIS) 5 MG TABS tablet Take by mouth. 06/08/23   [provider]  tamsulosin (FLOMAX) 0.4 MG CAPS capsule Take 1 capsule (0.4 mg total) by mouth daily after supper. 06/07/23   Joline Maxcy, MD  Tiotropium Bromide-Olodaterol 2.5-2.5 MCG/ACT AERS Inhale 2 puffs into the lungs every morning. 05/21/23   [provider]      Allergies    Albumin (human), Aspirin, B12 folate [cobalamin combinations], Codeine, and Diclofenac    Review of Systems   Review of Systems  Cardiovascular:  Positive for leg swelling.  All other systems reviewed and are negative.   Physical Exam Updated Vital Signs BP (!) 172/90   Pulse 70   Temp 98 F (36.7 C)   Resp 20   Ht 5\' 8"  (1.727 m)    Wt 64.9 kg   SpO2 99%   BMI 21.74 kg/m  Physical Exam Vitals and nursing note reviewed.  Constitutional:      Appearance: Normal appearance.  HENT:     Head: Normocephalic and atraumatic.  Eyes:     Conjunctiva/sclera: Conjunctivae normal.  Cardiovascular:     Comments: Small amount of 1+ nonpitting edema to the right ankle Pulmonary:     Effort: Pulmonary effort is normal. No respiratory distress.  Skin:    General: Skin is warm and dry.     Comments: No overlying skin changes to the bilateral lower legs  Neurological:     Mental Status: He is alert.  Psychiatric:        Mood and Affect: Mood normal.        Behavior: Behavior normal.     ED Results / Procedures / Treatments   Labs (all labs ordered are listed, but only abnormal results are displayed) Labs Reviewed - No data to display  EKG None  Radiology US Venous Img Lower Right (DVT Study)  Result Date: 07/19/2023 CLINICAL DATA:  Right lower extremity edema. EXAM: RIGHT LOWER EXTREMITY VENOUS DOPPLER ULTRASOUND TECHNIQUE: Gray-scale sonography with graded compression, as well as color Doppler and duplex ultrasound were performed to evaluate the lower extremity deep venous systems from the level of the common femoral vein and including the common femoral, femoral,  profunda femoral, popliteal and calf veins including the posterior tibial, peroneal and gastrocnemius veins when visible. The superficial great saphenous vein was also interrogated. Spectral Doppler was utilized to evaluate flow at rest and with distal augmentation maneuvers in the common femoral, femoral and popliteal veins. COMPARISON:  None Available. FINDINGS: Contralateral Common Femoral Vein: Respiratory phasicity is normal and symmetric with the symptomatic side. No evidence of thrombus. Normal compressibility. Common Femoral Vein: No evidence of thrombus. Normal compressibility, respiratory phasicity and response to augmentation. Saphenofemoral Junction:  No evidence of thrombus. Normal compressibility and flow on color Doppler imaging. Profunda Femoral Vein: No evidence of thrombus. Normal compressibility and flow on color Doppler imaging. Femoral Vein: No evidence of thrombus. Normal compressibility, respiratory phasicity and response to augmentation. Popliteal Vein: No evidence of thrombus. Normal compressibility, respiratory phasicity and response to augmentation. Calf Veins: No evidence of thrombus. Normal compressibility and flow on color Doppler imaging. Superficial Great Saphenous Vein: No evidence of thrombus. Normal compressibility. Venous Reflux:  None. Other Findings: No evidence of superficial thrombophlebitis or abnormal fluid collection. IMPRESSION: No evidence of right lower extremity deep venous thrombosis. Electronically Signed   By: Irish Lack M.D.   On: 07/19/2023 17:40    Procedures Procedures    Medications Ordered in ED Medications - No data to display  ED Course/ Medical Decision Making/ A&P                                 Medical Decision Making  This patient is a 72 y.o. male  who presents to the ED for concern of right lower leg swelling.   Differential diagnoses prior to evaluation: The emergent differential diagnosis includes, but is not limited to,  DVT, chronic venous insufficiency, cellulitis. This is not an exhaustive differential.   Past Medical History / Co-morbidities / Social History: hypertension, kidney stones, skin cancer, bilateral pulmonary embolisms on Eliquis  Additional history: Chart reviewed. Pertinent results include: [facility]  Physical Exam: Physical exam performed. The pertinent findings include: Hypertensive, otherwise normal vital signs.  No acute distress.  Small amount of 1+ nonpitting edema to the right ankle, without overlying skin changes.  No calf tenderness or swelling.  Lab Tests/Imaging studies: I personally  interpreted labs/imaging and the pertinent results include: Ultrasound of the right lower extremity shows no evidence of DVT.   Disposition: After consideration of the diagnostic results and the patients response to treatment, I feel that emergency department workup does not suggest an emergent condition requiring admission or immediate intervention beyond what has been performed at this time. The plan is: Discharged home with follow-up with the [place].  No emergent etiology suspected for the cause of his leg swelling.  He plans to follow-up regarding his medications, as he feels the inhaler he was recently on contributed to some of his symptoms today. The patient is safe for discharge and has been instructed to return immediately for worsening symptoms, change in symptoms or any other concerns.  Final Clinical Impression(s) / ED Diagnoses Final diagnoses:  Right leg pain    Rx / DC Orders ED Discharge Orders     None      Portions of this report may have been transcribed using voice recognition software. Every effort was made to ensure accuracy; however, inadvertent computerized transcription errors may be present.    Zannah Melucci T, PA-C 07/20/23 1021    Davanee Klinkner,  Elzada Pytel T, PA-C 07/20/23 1022    Gloris Manchester, MD 07/23/23 [phone]

## 2023-07-19 NOTE — Discharge Instructions (Signed)
You were seen in the ER for leg pain and swelling.  As we discussed, your ultrasound did not show any evidence of a blood clot in your leg. Please continue your eliquis as prescribed.  I recommend trying to elevate your leg to help with swelling.   Please follow up with your primary doctor and your pulmonologist as scheduled.

## 2023-07-19 NOTE — ED Triage Notes (Signed)
[facility]  Pt seen Dr Sherene Sires recently. Left lower leg pain, concerned for DVT.  Denies any SOB, hx of PE's, currenlty on Eliquis

## 2024-05-18 ENCOUNTER — Ambulatory Visit
Admission: EM | Admit: 2024-05-18 | Discharge: 2024-05-18 | Disposition: A | Discharge status: Home / Self Care | Attending: Nurse Practitioner | Admitting: Nurse Practitioner

## 2024-05-18 DIAGNOSIS — R21 Rash and other nonspecific skin eruption: Secondary | ICD-10-CM | POA: Diagnosis not present

## 2024-05-18 MED ORDER — CHLORHEXIDINE GLUCONATE 4 % EX SOLN: CUTANEOUS | 0 refills | Status: AC

## 2024-05-18 NOTE — Discharge Instructions (Signed)
 Use medication as prescribed. May also take over-the-counter Zyrtec during the daytime or Benadryl at bedtime as needed to help with itching. May apply cool cloths to the area to help with itching or discomfort. Avoid scratching, rubbing, or manipulating the areas while symptoms persist. Continue to monitor the areas for worsening, you may follow-up in this clinic or with your primary care physician if symptoms fail to improve. Follow-up as needed.

## 2024-05-18 NOTE — ED Provider Notes (Signed)
 [facility]    CSN: [id number] Arrival date & time: 05/18/24  9066      History   Chief Complaint No chief complaint on file.   HPI Gary Hood is a 73 y.o. male.   The history is provided by the patient.   Patient presents for complaints of red spots to his back that been present for the past month.  Patient states that the areas are not itchy.  He states they appear to be improving since he noticed them approximately 1 month ago.  States that he was at the dermatologist prior to noticing the areas on his back, and the dermatologist did not mention them.  Patient denies exposure to new soaps, medications, lotions, foods, or detergents.  So far he has not tried any medications for the symptoms.  Past Medical History:  Diagnosis Date   Cancer [facility])    skin cancer   History of kidney stones    Hypertension    Pulmonary emboli (HCC)    Bilateral Lungs    Patient Active Problem List   Diagnosis Date Noted   DOE (dyspnea on exertion) 06/17/2023   COPD GOLD ? group B 06/17/2023   Pulmonary emboli [facility])    Right inguinal hernia 12/15/2022    Past Surgical History:  Procedure Laterality Date   ABDOMINAL SURGERY     HERNIA REPAIR     INGUINAL HERNIA REPAIR Right 12/15/2022   Procedure: HERNIA REPAIR INGUINAL ADULT W/ MESH;  Surgeon: Evonnie Dorothyann LABOR, DO;  Location: [facility];  Service: General;  Laterality: Right;   KIDNEY STONE SURGERY     PROCTECTOMY     skin cancer removal         Home Medications    Prior to Admission medications   Medication Sig Start Date End Date Taking? Authorizing Provider  albuterol (VENTOLIN HFA) 108 (90 Base) MCG/ACT inhaler Inhale 2 puffs into the lungs every 4 (four) hours as needed for wheezing or shortness of breath. 05/21/23   [provider]  apixaban (ELIQUIS) 5 MG TABS tablet Take by mouth. 06/08/23   [provider]  tamsulosin  (FLOMAX ) 0.4 MG CAPS capsule Take 1 capsule (0.4 mg total) by mouth  daily after supper. 06/07/23   Shona Layman BROCKS, MD  Tiotropium Bromide-Olodaterol 2.5-2.5 MCG/ACT AERS Inhale 2 puffs into the lungs every morning. 05/21/23   [provider]    Family History History reviewed. No pertinent family history.  Social History Social History   Tobacco Use   Smoking status: Former    Average packs/day: 0.5 packs/day for 47.0 years (23.5 ttl pk-yrs)    Types: Cigarettes    Start date: 1968    Passive exposure: Past   Smokeless tobacco: Never  Substance Use Topics   Alcohol use: Yes    Comment: occasionally   Drug use: No     Allergies   Alpha-gal, Albumin (human), Aspirin, B12 folate [cobalamin combinations], Codeine, and Diclofenac   Review of Systems Review of Systems Per HPI  Physical Exam Triage Vital Signs ED Triage Vitals  Encounter Vitals Group     BP 05/18/24 0943 (!) 153/81     Girls Systolic BP Percentile --      Girls Diastolic BP Percentile --      Boys Systolic BP Percentile --      Boys Diastolic BP Percentile --      Pulse Rate 05/18/24 0943 73     Resp 05/18/24 0943 18  Temp 05/18/24 0943 (!) 97.5 F (36.4 C)     Temp Source 05/18/24 0943 Oral     SpO2 05/18/24 0943 95 %     Weight --      Height --      Head Circumference --      Peak Flow --      Pain Score 05/18/24 0944 0     Pain Loc --      Pain Education --      Exclude from Growth Chart --    No data found.  Updated Vital Signs BP (!) 153/81 (BP Location: Right Arm)   Pulse 73   Temp (!) 97.5 F (36.4 C) (Oral)   Resp 18   SpO2 95%   Visual Acuity Right Eye Distance:   Left Eye Distance:   Bilateral Distance:    Right Eye Near:   Left Eye Near:    Bilateral Near:     Physical Exam Vitals and nursing note reviewed.  Constitutional:      General: He is not in acute distress.    Appearance: Normal appearance.  HENT:     Head: Normocephalic.  Eyes:     Extraocular Movements: Extraocular movements intact.     Pupils: Pupils  are equal, round, and reactive to light.  Pulmonary:     Effort: Pulmonary effort is normal.  Musculoskeletal:     Cervical back: Normal range of motion.  Skin:    General: Skin is warm and dry.     Findings: Rash present. Rash is papular.     Comments: Diffuse papular lesions noted to the back.  Lesions measure approximately 0.1 to 0.2 cm in diameter.  Some of the lesions have a pimple like appearance.  They are in no congruent pattern.  There is no oozing, fluctuance, or drainage present.  Neurological:     General: No focal deficit present.     Mental Status: He is alert and oriented to person, place, and time.  Psychiatric:        Mood and Affect: Mood normal.        Behavior: Behavior normal.      UC Treatments / Results  Labs (all labs ordered are listed, but only abnormal results are displayed) Labs Reviewed - No data to display  EKG   Radiology No results found.  Procedures Procedures (including critical care time)  Medications Ordered in UC Medications - No data to display  Initial Impression / Assessment and Plan / UC Course  I have reviewed the triage vital signs and the nursing notes.  Pertinent labs & imaging results that were available during my care of the patient were reviewed by me and considered in my medical decision making (see chart for details).  Patient with lesions to his back that have been present for the past month.  The areas do not itch and are not painful.  The lesions have a pimple like appearance.  Difficult to determine the cause of the rash.  No concern for shingles.  Will provide symptomatic treatment with Hibiclens  4% solution for patient to cleanse the affected area.  Supportive care recommendations were provided and discussed with the patient to include over-the-counter antihistamines as needed, applying cool cloths as needed for itching, and to monitor for worsening.  Patient was given indications regarding follow-up.  Patient was in  agreement with this plan of care and verbalizes understanding.  All questions were answered.  Patient stable for discharge.  Final Clinical  Impressions(s) / UC Diagnoses   Final diagnoses:  None   Discharge Instructions   None    ED Prescriptions   None    PDMP not reviewed this encounter.   Gilmer Etta PARAS, NP 05/18/24 1007

## 2024-05-18 NOTE — ED Triage Notes (Signed)
 Pt reports red spots on back x 1 mo itching present.

## 2024-08-18 ENCOUNTER — Emergency Department (HOSPITAL_COMMUNITY)

## 2024-08-18 ENCOUNTER — Encounter (HOSPITAL_COMMUNITY): Payer: Self-pay | Admitting: *Deleted

## 2024-08-18 ENCOUNTER — Emergency Department (HOSPITAL_COMMUNITY)
Admission: EM | Admit: 2024-08-18 | Discharge: 2024-08-18 | Disposition: A | Discharge status: Home / Self Care | Attending: Emergency Medicine | Admitting: Emergency Medicine

## 2024-08-18 ENCOUNTER — Other Ambulatory Visit: Payer: Self-pay

## 2024-08-18 DIAGNOSIS — I2699 Other pulmonary embolism without acute cor pulmonale: Secondary | ICD-10-CM | POA: Insufficient documentation

## 2024-08-18 DIAGNOSIS — J449 Chronic obstructive pulmonary disease, unspecified: Secondary | ICD-10-CM | POA: Diagnosis not present

## 2024-08-18 DIAGNOSIS — Z7901 Long term (current) use of anticoagulants: Secondary | ICD-10-CM | POA: Diagnosis not present

## 2024-08-18 DIAGNOSIS — R42 Dizziness and giddiness: Secondary | ICD-10-CM | POA: Insufficient documentation

## 2024-08-18 LAB — URINALYSIS, ROUTINE W REFLEX MICROSCOPIC
Bacteria, UA: NONE SEEN
Bilirubin Urine: NEGATIVE
Glucose, UA: NEGATIVE mg/dL
Ketones, ur: NEGATIVE mg/dL
Leukocytes,Ua: NEGATIVE
Nitrite: NEGATIVE
Protein, ur: NEGATIVE mg/dL
Specific Gravity, Urine: 1.034 — ABNORMAL HIGH (ref 1.005–1.030)
pH: 7 (ref 5.0–8.0)

## 2024-08-18 LAB — CBC
HCT: 40.5 % (ref 39.0–52.0)
Hemoglobin: 13.9 g/dL (ref 13.0–17.0)
MCH: 33 pg (ref 26.0–34.0)
MCHC: 34.3 g/dL (ref 30.0–36.0)
MCV: 96.2 fL (ref 80.0–100.0)
Platelets: 288 K/uL (ref 150–400)
RBC: 4.21 MIL/uL — ABNORMAL LOW (ref 4.22–5.81)
RDW: 12.5 % (ref 11.5–15.5)
WBC: 10 K/uL (ref 4.0–10.5)
nRBC: 0 % (ref 0.0–0.2)

## 2024-08-18 LAB — BASIC METABOLIC PANEL WITH GFR
Anion gap: 10 (ref 5–15)
BUN: 7 mg/dL — ABNORMAL LOW (ref 8–23)
CO2: 27 mmol/L (ref 22–32)
Calcium: 8.6 mg/dL — ABNORMAL LOW (ref 8.9–10.3)
Chloride: 104 mmol/L (ref 98–111)
Creatinine, Ser: 0.76 mg/dL (ref 0.61–1.24)
GFR, Estimated: >60 mL/min (ref >60)
Glucose, Bld: 84 mg/dL (ref 70–99)
Potassium: 3 mmol/L — ABNORMAL LOW (ref 3.5–5.1)
Sodium: 141 mmol/L (ref 135–145)

## 2024-08-18 LAB — PRO BRAIN NATRIURETIC PEPTIDE: Pro Brain Natriuretic Peptide: 120 pg/mL (ref <300.0)

## 2024-08-18 LAB — TROPONIN T, HIGH SENSITIVITY
Troponin T High Sensitivity: <15 ng/L (ref 0–19)
Troponin T High Sensitivity: <15 ng/L (ref 0–19)

## 2024-08-18 MED ORDER — MECLIZINE HCL 12.5 MG PO TABS
25.0000 mg | ORAL_TABLET | Freq: Once | ORAL | Status: AC
  Administered 2024-08-18: 25 mg via ORAL
  Filled 2024-08-18: qty 2

## 2024-08-18 MED ORDER — POTASSIUM CHLORIDE CRYS ER 20 MEQ PO TBCR
40.0000 meq | EXTENDED_RELEASE_TABLET | Freq: Once | ORAL | Status: AC
  Administered 2024-08-18: 40 meq via ORAL
  Filled 2024-08-18: qty 2

## 2024-08-18 MED ORDER — MECLIZINE HCL 25 MG PO TABS: 25.0000 mg | ORAL_TABLET | Freq: Three times a day (TID) | ORAL | 0 refills | Status: AC | PRN

## 2024-08-18 MED ORDER — IOHEXOL 350 MG/ML SOLN
75.0000 mL | Freq: Once | INTRAVENOUS | Status: AC | PRN
  Administered 2024-08-18: 75 mL via INTRAVENOUS

## 2024-08-18 NOTE — ED Notes (Signed)
 Placed on cardiac monitor

## 2024-08-18 NOTE — Discharge Instructions (Addendum)
 It was pleasure taking care of you today.  You were seen in the emergency room for dizziness.  Your blood work showed that your potassium was slightly low, so we gave you some additional potassium here, make sure you eat a diet high in potassium with food sessions citrus fruits, potatoes, bananas.  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  I spoke with our neurologist here, who does not feel that there is any reason to intervene on this narrowing, as it would be more dangerous to have the procedure began to leave it alone.  You can talk to your PCP about this, they may advise some medications to help prevent risk of another stroke in the future and/or refer you to neurology for further management on prevention of further stroke.  Your dizziness does not seem to be related to a stroke today, rather it seems to be related to an inner ear problem.  We are prescribing meclizine which you received here and this helped your symptoms.  Follow-up with your primary care doctor, if you are still having symptoms they may want to refer you to ENT.

## 2024-08-18 NOTE — ED Triage Notes (Signed)
 Pt states his oxygen level was low, states he got 61%-97-100% in triage. Pt c/o dizziness and CP x 2 days.

## 2024-08-18 NOTE — ED Provider Notes (Signed)
 [place] EMERGENCY DEPARTMENT AT [facility] Provider Note   CSN: [id number] Arrival date & time: 08/18/24  1419     Patient presents with: Chest Pain   Gary Hood is a 73 y.o. male.  He has history of COPD and pulmonary emboli, on chronic Eliquis for this.  He reports he been compliant with his Eliquis.  He comes in the ER today complaining of dizziness described as a spinning sensation that started 2 days ago.  Started when he was driving his car, he stopped and felt like he was still moving and this worried him.  He has felt somewhat off balance but has not had a fall and does not feel like he is stumbling.  He denies any vision changes, denies numbness tingling or weakness.  He reports he checked his oxygen several times as he checks it every day and it has gone as low as 61%.  He does have COPD and is not on home oxygen.  He has not felt more short of breath than his baseline.  He states he has chest pain that is chronic and unchanged for him.    Chest Pain      Prior to Admission medications   Medication Sig Start Date End Date Taking? Authorizing Provider  albuterol (VENTOLIN HFA) 108 (90 Base) MCG/ACT inhaler Inhale 2 puffs into the lungs every 4 (four) hours as needed for wheezing or shortness of breath. 05/21/23   [provider]  apixaban (ELIQUIS) 5 MG TABS tablet Take by mouth. 06/08/23   [provider]  chlorhexidine  (HIBICLENS ) 4 % external liquid Apply a small amount to warm water and clean the affected area daily until symptoms improve. 05/18/24   Leath-Warren, Etta PARAS, NP  tamsulosin  (FLOMAX ) 0.4 MG CAPS capsule Take 1 capsule (0.4 mg total) by mouth daily after supper. 06/07/23   Shona Layman BROCKS, MD  Tiotropium Bromide-Olodaterol 2.5-2.5 MCG/ACT AERS Inhale 2 puffs into the lungs every morning. 05/21/23   [provider]    Allergies: Alpha-gal, Albumin (human), Aspirin, B12 folate [cobalamin combinations], Codeine, and  Diclofenac    Review of Systems  Cardiovascular:  Positive for chest pain.    Updated Vital Signs BP (!) 155/89 (BP Location: Right Arm)   Pulse 74   Temp (!) 97.4 F (36.3 C)   Resp 19   Ht 5' 8 (1.727 m)   Wt 63.5 kg   SpO2 95%   BMI 21.29 kg/m   Physical Exam Vitals and nursing note reviewed.  Constitutional:      General: He is not in acute distress.    Appearance: He is well-developed.  HENT:     Head: Normocephalic and atraumatic.  Eyes:     Conjunctiva/sclera: Conjunctivae normal.  Cardiovascular:     Rate and Rhythm: Normal rate and regular rhythm.     Heart sounds: No murmur heard. Pulmonary:     Effort: Pulmonary effort is normal. No respiratory distress.     Breath sounds: Normal breath sounds.  Abdominal:     Palpations: Abdomen is soft.     Tenderness: There is no abdominal tenderness.  Musculoskeletal:        General: No swelling. Normal range of motion.     Cervical back: Neck supple.     Right lower leg: No tenderness. No edema.     Left lower leg: No tenderness. No edema.  Skin:    General: Skin is warm and dry.     Capillary Refill:  Capillary refill takes less than 2 seconds.  Neurological:     Mental Status: He is alert.  Psychiatric:        Mood and Affect: Mood normal.     (all labs ordered are listed, but only abnormal results are displayed) Labs Reviewed  BASIC METABOLIC PANEL WITH GFR  CBC  TROPONIN T, HIGH SENSITIVITY    EKG: None  Radiology: No results found.   Procedures   Medications Ordered in the ED - No data to display                                  Medical Decision Making This patient presents to the ED for concern of dizziness, this involves an extensive number of treatment options, and is a complaint that carries with it a high risk of complications and morbidity.  The differential diagnosis includes peripheral vertigo, CVA, labyrinthitis, other   Co morbidities that complicate the patient evaluation :    COPD, pulmonary embolus   Additional history obtained:  Additional history obtained from EMR External records from outside source obtained and reviewed including previous notes   Lab Tests:  I Ordered, and personally interpreted labs.  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  I personally viewed and interpreted the cardiac monitored which showed an underlying rhythm of: sinus rhythm   Consultations Obtained:  I requested consultation with the neurologist Dr. 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   Problem List / ED Course / Critical interventions / Medication management  Dizziness-patient has had 2 days of dizziness described as a spinning sensation and feeling somewhat off balance, it is worse with sitting up and with movement.  He has no focal neurologic findings and is not having associated symptoms such as weakness vision changes, speech changes, confusion, numbness or tingling.  He is feeling much better after meclizine and is able to ambulate.  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  Neurology did not feel there is any intervention needed.  Likely peripheral cause.  He does not have UTI that would explain the recrudescence and he is feeling much better and able to ambulate after meclizine.  His potassium was slightly low at 3.0 and this was repleted he was advised on high potassium diet.  Patient's chief complaint was noted on tach into the chest pain.  EKG and troponin normal and he states he has chronic pain in his chest and this is fully unchanged I am not suspicious of an ACS  at this time.  He is not short of breath outside of his usual.  He did report he had some low O2 readings at home on his home pulse ox but here has remained between 95 and 98% on room air, likely an errant reading on his home device.  He does have history of PE but has been compliant with his Eliquis denying any stenosis.  Do not suspect PE or other pulmonary etiology as he is not having any increased shortness of breath. I ordered medication including meclizine  for dizziness  Reevaluation of the patient after these medicines showed that the patient improved I have reviewed the patients home medicines and have made adjustments as needed       Amount and/or Complexity of Data Reviewed Labs: ordered. Radiology: ordered.  Risk Prescription drug management.        Final diagnoses:  None    ED Discharge Orders     None          Suellen Sherran DELENA DEVONNA 08/18/24 1830    Melvenia Motto, MD 08/19/24 1640
